# Patient Record
Sex: Female | Born: 1985 | Hispanic: No | Marital: Married | Smoking: Never smoker
Health system: Southern US, Community
[De-identification: ages and names within clinical notes are randomized; demographics above are authoritative.]

## PROBLEM LIST (undated history)

## (undated) ENCOUNTER — Inpatient Hospital Stay (HOSPITAL_COMMUNITY): Payer: Self-pay

## (undated) DIAGNOSIS — O24419 Gestational diabetes mellitus in pregnancy, unspecified control: Secondary | ICD-10-CM

## (undated) DIAGNOSIS — E119 Type 2 diabetes mellitus without complications: Secondary | ICD-10-CM

## (undated) DIAGNOSIS — Z8759 Personal history of other complications of pregnancy, childbirth and the puerperium: Secondary | ICD-10-CM

## (undated) HISTORY — DX: Type 2 diabetes mellitus without complications: E11.9

## (undated) HISTORY — PX: NO PAST SURGERIES: SHX2092

---

## 2016-12-06 ENCOUNTER — Inpatient Hospital Stay (HOSPITAL_COMMUNITY)
Admission: AD | Admit: 2016-12-06 | Discharge: 2016-12-07 | Disposition: A | Discharge status: Home / Self Care | Payer: 59 | Source: Ambulatory Visit | Attending: Obstetrics and Gynecology | Admitting: Obstetrics and Gynecology

## 2016-12-06 ENCOUNTER — Encounter (HOSPITAL_COMMUNITY): Payer: Self-pay | Admitting: *Deleted

## 2016-12-06 DIAGNOSIS — D251 Intramural leiomyoma of uterus: Secondary | ICD-10-CM | POA: Insufficient documentation

## 2016-12-06 DIAGNOSIS — O209 Hemorrhage in early pregnancy, unspecified: Secondary | ICD-10-CM | POA: Diagnosis not present

## 2016-12-06 DIAGNOSIS — O3412 Maternal care for benign tumor of corpus uteri, second trimester: Secondary | ICD-10-CM | POA: Insufficient documentation

## 2016-12-06 DIAGNOSIS — O2341 Unspecified infection of urinary tract in pregnancy, first trimester: Secondary | ICD-10-CM | POA: Diagnosis not present

## 2016-12-06 DIAGNOSIS — Z3A08 8 weeks gestation of pregnancy: Secondary | ICD-10-CM | POA: Diagnosis not present

## 2016-12-06 DIAGNOSIS — O3680X0 Pregnancy with inconclusive fetal viability, not applicable or unspecified: Secondary | ICD-10-CM

## 2016-12-06 LAB — POCT PREGNANCY, URINE: PREG TEST UR: POSITIVE — AB

## 2016-12-06 NOTE — MAU Note (Signed)
Spotting for past 4 days. Brown color. Tonight has changed to red and more like a period. No pain currently. Occ pain L side

## 2016-12-06 NOTE — MAU Provider Note (Signed)
History     CSN: [id number]  Arrival date and time: 12/06/16 2319   First Provider Initiated Contact with Patient 12/06/16 2354      Chief Complaint  Patient presents with  . Vaginal Bleeding   HPI [name] is a 31 y.o. G1P0 at [redacted]w[redacted]d who presents to MAU today with complaint of vaginal bleeding x 4 days. Initially it was a small amount of dark brown, but tonight became bright red. She denies pain, fever, N/V today. She states LMP 10/11/16.   OB History    Gravida Para Term Preterm AB Living   1             SAB TAB Ectopic Multiple Live Births                  Past Medical History:  Diagnosis Date  . Medical history non-contributory     Past Surgical History:  Procedure Laterality Date  . NO PAST SURGERIES      No family history on file.  Social History  Substance Use Topics  . Smoking status: Never Smoker  . Smokeless tobacco: Never Used  . Alcohol use No    Allergies: Allergies not on file  No prescriptions prior to admission.    Review of Systems  Constitutional: Negative for fever.  Gastrointestinal: Negative for abdominal pain, constipation, diarrhea, nausea and vomiting.  Genitourinary: Positive for vaginal bleeding. Negative for dysuria, frequency, urgency and vaginal discharge.   Physical Exam   Blood pressure 122/82, pulse (!) 105, temperature 98.1 F (36.7 C), resp. rate 16, height 5\' 3"  (1.6 m), weight 136 lb (61.7 kg), last menstrual period 10/11/2016.  Physical Exam  Nursing note and vitals reviewed. Constitutional: She is oriented to person, place, and time. She appears well-developed and well-nourished. No distress.  HENT:  Head: Normocephalic and atraumatic.  Cardiovascular: Normal rate.   Respiratory: Effort normal.  GI: Soft. She exhibits no distension and no mass. There is no tenderness. There is no rebound and no guarding.  Genitourinary: Uterus is not enlarged and not tender. Cervix exhibits no motion tenderness, no  discharge and no friability. Right adnexum displays no mass and no tenderness. Left adnexum displays no mass and no tenderness. There is bleeding (small) in the vagina. No vaginal discharge found.  Neurological: She is alert and oriented to person, place, and time.  Skin: Skin is warm and dry. No erythema.  Psychiatric: She has a normal mood and affect.    Results for orders placed or performed during the hospital encounter of 12/06/16 (from the past 24 hour(s))  Urinalysis, Routine w reflex microscopic     Status: Abnormal   Collection Time: 12/06/16 11:35 PM  Result Value Ref Range   Color, Urine YELLOW YELLOW   APPearance CLEAR CLEAR   Specific Gravity, Urine 1.010 1.005 - 1.030   pH 5.5 5.0 - 8.0   Glucose, UA NEGATIVE NEGATIVE mg/dL   Hgb urine dipstick LARGE (A) NEGATIVE   Bilirubin Urine NEGATIVE NEGATIVE   Ketones, ur NEGATIVE NEGATIVE mg/dL   Protein, ur 30 (A) NEGATIVE mg/dL   Nitrite POSITIVE (A) NEGATIVE   Leukocytes, UA NEGATIVE NEGATIVE  Urinalysis, Microscopic (reflex)     Status: Abnormal   Collection Time: 12/06/16 11:35 PM  Result Value Ref Range   RBC / HPF TOO NUMEROUS TO COUNT 0 - 5 RBC/hpf   WBC, UA TOO NUMEROUS TO COUNT 0 - 5 WBC/hpf   Bacteria, UA MANY (A) NONE SEEN  Squamous Epithelial / LPF 0-5 (A) NONE SEEN  Pregnancy, urine POC     Status: Abnormal   Collection Time: 12/07/16 12:00 AM  Result Value Ref Range   Preg Test, Ur POSITIVE (A) NEGATIVE  CBC with Differential/Platelet     Status: Abnormal   Collection Time: 12/07/16 12:12 AM  Result Value Ref Range   WBC 9.6 4.0 - 10.5 K/uL   RBC 4.88 3.87 - 5.11 MIL/uL   Hemoglobin 12.4 12.0 - 15.0 g/dL   HCT 38.0 36.0 - 46.0 %   MCV 77.9 (L) 78.0 - 100.0 fL   MCH 25.4 (L) 26.0 - 34.0 pg   MCHC 32.6 30.0 - 36.0 g/dL   RDW 14.0 11.5 - 15.5 %   Platelets 321 150 - 400 K/uL   Neutrophils Relative % 62 %   Neutro Abs 6.0 1.7 - 7.7 K/uL   Lymphocytes Relative 34 %   Lymphs Abs 3.2 0.7 - 4.0 K/uL    Monocytes Relative 3 %   Monocytes Absolute 0.3 0.1 - 1.0 K/uL   Eosinophils Relative 1 %   Eosinophils Absolute 0.1 0.0 - 0.7 K/uL   Basophils Relative 0 %   Basophils Absolute 0.0 0.0 - 0.1 K/uL  ABO/Rh     Status: None (Preliminary result)   Collection Time: 12/07/16 12:12 AM  Result Value Ref Range   ABO/RH(D) B POS   hCG, quantitative, pregnancy     Status: Abnormal   Collection Time: 12/07/16 12:12 AM  Result Value Ref Range   hCG, Beta Chain, Quant, S 10,564 (H) <5 mIU/mL   US Ob Comp Less 14 Wks  Result Date: 12/07/2016 CLINICAL DATA:  Positive pregnancy test, vaginal bleeding. Gestational age by last menstrual period 8 weeks and 1 days. EXAM: OBSTETRIC <14 WK [place] AND TRANSVAGINAL OB US TECHNIQUE: Both transabdominal and transvaginal ultrasound examinations were performed for complete evaluation of the gestation as well as the maternal uterus, adnexal regions, and pelvic cul-de-sac. Transvaginal technique was performed to assess early pregnancy. COMPARISON:  None. FINDINGS: Intrauterine gestational sac: Present with minimal internal echoes. Yolk sac:  Not present Embryo:  Not present Cardiac Activity: Not present MSD: 8  mm   5 w   3  d Subchorionic hemorrhage:  None visualized. Maternal uterus/adnexae: 2.3 cm LEFT dominant follicle. Otherwise unremarkable adnexae. Two subcentimeter hypoechoic intramural leiomyomas. IMPRESSION: Probable early intrauterine gestational sac, but no yolk sac, fetal pole, or cardiac activity yet visualized. Recommend follow-up quantitative B-HCG levels and follow-up US in 14 days to assess viability. This recommendation follows SRU consensus guidelines: Diagnostic Criteria for Nonviable Pregnancy Early in the First Trimester. [employer] [employer] 2013; 295:6213-08. Electronically Signed   By: Elon Alas M.D.   On: 12/07/2016 01:01   US Ob Transvaginal  Result Date: 12/07/2016 CLINICAL DATA:  Positive pregnancy test, vaginal bleeding. Gestational age by last  menstrual period 8 weeks and 1 days. EXAM: OBSTETRIC <14 WK [place] AND TRANSVAGINAL OB US TECHNIQUE: Both transabdominal and transvaginal ultrasound examinations were performed for complete evaluation of the gestation as well as the maternal uterus, adnexal regions, and pelvic cul-de-sac. Transvaginal technique was performed to assess early pregnancy. COMPARISON:  None. FINDINGS: Intrauterine gestational sac: Present with minimal internal echoes. Yolk sac:  Not present Embryo:  Not present Cardiac Activity: Not present MSD: 8  mm   5 w   3  d Subchorionic hemorrhage:  None visualized. Maternal uterus/adnexae: 2.3 cm LEFT dominant follicle. Otherwise unremarkable adnexae. Two subcentimeter hypoechoic intramural  leiomyomas. IMPRESSION: Probable early intrauterine gestational sac, but no yolk sac, fetal pole, or cardiac activity yet visualized. Recommend follow-up quantitative B-HCG levels and follow-up US in 14 days to assess viability. This recommendation follows SRU consensus guidelines: Diagnostic Criteria for Nonviable Pregnancy Early in the First Trimester. [employer] 2013; 660:6004-59. Electronically Signed   By: Elon Alas M.D.   On: 12/07/2016 01:01    MAU Course  Procedures None  MDM +UPT UA, CBC, ABO/Rh, quant hCG and [place] today to rule out ectopic pregnancy Discussed patient with Dr. Landry Mellow. Follow-up in MAU on Sunday for repeat hCG levels.  Urine culture ordered Assessment and Plan  A: Pregnancy of unknown location  Vaginal bleeding in pregnancy, first trimester  UTI in pregnancy, first trimester   P: Discharge home Rx for Macrobid given to the patient  Ectopic/bleeding precautions discussed Patient advised to follow-up in MAU on Sunday evening for 48 hours repeat hCG labs Patient may return to MAU as needed or if her condition were to change or worsen   Kerry Hough, PA-C 12/07/2016, 1:27 AM

## 2016-12-07 ENCOUNTER — Inpatient Hospital Stay (HOSPITAL_COMMUNITY): Payer: 59

## 2016-12-07 DIAGNOSIS — O209 Hemorrhage in early pregnancy, unspecified: Secondary | ICD-10-CM | POA: Diagnosis not present

## 2016-12-07 LAB — CBC WITH DIFFERENTIAL/PLATELET
Basophils Absolute: 0 10*3/uL (ref 0.0–0.1)
Basophils Relative: 0 %
EOS ABS: 0.1 10*3/uL (ref 0.0–0.7)
EOS PCT: 1 %
HCT: 38 % (ref 36.0–46.0)
HEMOGLOBIN: 12.4 g/dL (ref 12.0–15.0)
LYMPHS ABS: 3.2 10*3/uL (ref 0.7–4.0)
LYMPHS PCT: 34 %
MCH: 25.4 pg — AB (ref 26.0–34.0)
MCHC: 32.6 g/dL (ref 30.0–36.0)
MCV: 77.9 fL — AB (ref 78.0–100.0)
MONOS PCT: 3 %
Monocytes Absolute: 0.3 10*3/uL (ref 0.1–1.0)
NEUTROS PCT: 62 %
Neutro Abs: 6 10*3/uL (ref 1.7–7.7)
Platelets: 321 10*3/uL (ref 150–400)
RBC: 4.88 MIL/uL (ref 3.87–5.11)
RDW: 14 % (ref 11.5–15.5)
WBC: 9.6 10*3/uL (ref 4.0–10.5)

## 2016-12-07 LAB — URINALYSIS, ROUTINE W REFLEX MICROSCOPIC
BILIRUBIN URINE: NEGATIVE
Glucose, UA: NEGATIVE mg/dL
Ketones, ur: NEGATIVE mg/dL
Leukocytes, UA: NEGATIVE
Nitrite: POSITIVE — AB
PH: 5.5 (ref 5.0–8.0)
Protein, ur: 30 mg/dL — AB
SPECIFIC GRAVITY, URINE: 1.01 (ref 1.005–1.030)

## 2016-12-07 LAB — URINALYSIS, MICROSCOPIC (REFLEX)

## 2016-12-07 LAB — HCG, QUANTITATIVE, PREGNANCY: hCG, Beta Chain, Quant, S: 10564 m[IU]/mL — ABNORMAL HIGH (ref <5)

## 2016-12-07 LAB — ABO/RH: ABO/RH(D): B POS

## 2016-12-07 MED ORDER — NITROFURANTOIN MONOHYD MACRO 100 MG PO CAPS: 100.0000 mg | ORAL_CAPSULE | Freq: Two times a day (BID) | ORAL | 0 refills | Status: DC

## 2016-12-07 NOTE — Discharge Instructions (Signed)
Vaginal Bleeding During Pregnancy, First Trimester °A small amount of bleeding (spotting) from the vagina is common in early pregnancy. Sometimes the bleeding is normal and is not a problem, and sometimes it is a sign of something serious. Be sure to tell your doctor about any bleeding from your vagina right away. °Follow these instructions at home: °· Watch your condition for any changes. °· Follow your doctor's instructions about how active you can be. °· If you are on bed rest: °? You may need to stay in bed and only get up to use the bathroom. °? You may be allowed to do some activities. °? If you need help, make plans for someone to help you. °· Write down: °? The number of pads you use each day. °? How often you change pads. °? How soaked (saturated) your pads are. °· Do not use tampons. °· Do not douche. °· Do not have sex or orgasms until your doctor says it is okay. °· If you pass any tissue from your vagina, save the tissue so you can show it to your doctor. °· Only take medicines as told by your doctor. °· Do not take aspirin because it can make you bleed. °· Keep all follow-up visits as told by your doctor. °Contact a doctor if: °· You bleed from your vagina. °· You have cramps. °· You have labor pains. °· You have a fever that does not go away after you take medicine. °Get help right away if: °· You have very bad cramps in your back or belly (abdomen). °· You pass large clots or tissue from your vagina. °· You bleed more. °· You feel light-headed or weak. °· You pass out (faint). °· You have chills. °· You are leaking fluid or have a gush of fluid from your vagina. °· You pass out while pooping (having a bowel movement). °This information is not intended to replace advice given to you by your health care provider. Make sure you discuss any questions you have with your health care provider. °Document Released: 06/14/2013 Document Revised: 07/06/2015 Document Reviewed: 10/05/2012 °Elsevier Interactive  Patient Education © 2018 [employer] ° °

## 2016-12-08 ENCOUNTER — Inpatient Hospital Stay (HOSPITAL_COMMUNITY)
Admission: AD | Admit: 2016-12-08 | Discharge: 2016-12-08 | Disposition: A | Discharge status: Home / Self Care | Payer: 59 | Source: Ambulatory Visit | Attending: Obstetrics and Gynecology | Admitting: Obstetrics and Gynecology

## 2016-12-08 DIAGNOSIS — O209 Hemorrhage in early pregnancy, unspecified: Secondary | ICD-10-CM | POA: Diagnosis not present

## 2016-12-08 DIAGNOSIS — Z3A08 8 weeks gestation of pregnancy: Secondary | ICD-10-CM | POA: Diagnosis not present

## 2016-12-08 DIAGNOSIS — O200 Threatened abortion: Secondary | ICD-10-CM

## 2016-12-08 LAB — CULTURE, OB URINE: CULTURE: NO GROWTH

## 2016-12-08 LAB — HCG, QUANTITATIVE, PREGNANCY: HCG, BETA CHAIN, QUANT, S: 9154 m[IU]/mL — AB (ref <5)

## 2016-12-08 NOTE — MAU Note (Signed)
Pt here for follow up blood work. Was seen on Friday night with bleeding, no pain. Still no pain; bleeding is less today.

## 2016-12-08 NOTE — MAU Note (Signed)
Chief Complaint: Vaginal Bleeding   None    SUBJECTIVE HPI: Julie Fleming is a 31 y.o. G1P0 at [redacted]w[redacted]d who presents to [facility] Ad[facility] for repeat [facility].  Pt was seen late Friday night for vaginal bleeding in early pregnancy.  US showed gestational sac with no yolk sac or fetal pole.  QBHCG was  10564.  Pt denies bleeding or cramping. Pt was given Macrobid for UTI on Friday but has not started medication.   Desired pregnancy.  No other medical problems.   Past Medical History:  Diagnosis Date  . Medical history non-contributory    OB History  Gravida Para Term Preterm AB Living  1            SAB TAB Ectopic Multiple Live Births               # Outcome Date GA Lbr Len/2nd Weight Sex Delivery Anes PTL Lv  1 Current              Past Surgical History:  Procedure Laterality Date  . NO PAST SURGERIES     Social History   Social History  . Marital status: Married    Spouse name: N/A  . Number of children: N/A  . Years of education: N/A   Occupational History  . Not on file.   Social History Main Topics  . Smoking status: Never Smoker  . Smokeless tobacco: Never Used  . Alcohol use No  . Drug use: No  . Sexual activity: Yes    Birth control/ protection: None   Other Topics Concern  . Not on file   Social History Narrative  . No narrative on file   No family history on file. No current facility-administered medications on file prior to encounter.    Current Outpatient Prescriptions on File Prior to Encounter  Medication Sig Dispense Refill  . nitrofurantoin, macrocrystal-monohydrate, (MACROBID) 100 MG capsule Take 1 capsule (100 mg total) by mouth 2 (two) times daily. 14 capsule 0   No Known Allergies  I have reviewed patient's Past Medical Hx, Surgical Hx, Family Hx, Social Hx, medications and allergies.   Review of Systems  OBJECTIVE Patient Vitals for the past 24 hrs:  BP Temp Temp src Pulse Resp SpO2  12/08/16 2117 127/88 98.3 F (36.8 C) Oral  80 18 100 %  12/08/16 1926 (!) 106/59 97.6 F (36.4 C) - 86 16 100 %   Constitutional: Well-developed, well-nourished female in no acute distress.  Cardiovascular: normal rate Respiratory: normal rate and effort.   LAB RESULTS Results for orders placed or performed during the hospital encounter of 12/08/16 (from the past 24 hour(s))  hCG, quantitative, pregnancy     Status: Abnormal   Collection Time: 12/08/16  7:33 PM  Result Value Ref Range   hCG, Beta Chain, Quant, S 9,154 (H) <5 mIU/mL    IMAGING US Ob Comp Less 14 Wks  Result Date: 12/07/2016 CLINICAL DATA:  Positive pregnancy test, vaginal bleeding. Gestational age by last menstrual period 8 weeks and 1 days. EXAM: OBSTETRIC <14 WK [place] AND TRANSVAGINAL OB US TECHNIQUE: Both transabdominal and transvaginal ultrasound examinations were performed for complete evaluation of the gestation as well as the maternal uterus, adnexal regions, and pelvic cul-de-sac. Transvaginal technique was performed to assess early pregnancy. COMPARISON:  None. FINDINGS: Intrauterine gestational sac: Present with minimal internal echoes. Yolk sac:  Not present Embryo:  Not present Cardiac Activity: Not present MSD: 8  mm   5 w  3  d Subchorionic hemorrhage:  None visualized. Maternal uterus/adnexae: 2.3 cm LEFT dominant follicle. Otherwise unremarkable adnexae. Two subcentimeter hypoechoic intramural leiomyomas. IMPRESSION: Probable early intrauterine gestational sac, but no yolk sac, fetal pole, or cardiac activity yet visualized. Recommend follow-up quantitative B-HCG levels and follow-up US in 14 days to assess viability. This recommendation follows SRU consensus guidelines: Diagnostic Criteria for Nonviable Pregnancy Early in the First Trimester. [employer] 2013; 563:8756-43. Electronically Signed   By: Elon Alas M.D.   On: 12/07/2016 01:01   US Ob Transvaginal  Result Date: 12/07/2016 CLINICAL DATA:  Positive pregnancy test, vaginal bleeding.  Gestational age by last menstrual period 8 weeks and 1 days. EXAM: OBSTETRIC <14 WK [place] AND TRANSVAGINAL OB US TECHNIQUE: Both transabdominal and transvaginal ultrasound examinations were performed for complete evaluation of the gestation as well as the maternal uterus, adnexal regions, and pelvic cul-de-sac. Transvaginal technique was performed to assess early pregnancy. COMPARISON:  None. FINDINGS: Intrauterine gestational sac: Present with minimal internal echoes. Yolk sac:  Not present Embryo:  Not present Cardiac Activity: Not present MSD: 8  mm   5 w   3  d Subchorionic hemorrhage:  None visualized. Maternal uterus/adnexae: 2.3 cm LEFT dominant follicle. Otherwise unremarkable adnexae. Two subcentimeter hypoechoic intramural leiomyomas. IMPRESSION: Probable early intrauterine gestational sac, but no yolk sac, fetal pole, or cardiac activity yet visualized. Recommend follow-up quantitative B-HCG levels and follow-up US in 14 days to assess viability. This recommendation follows SRU consensus guidelines: Diagnostic Criteria for Nonviable Pregnancy Early in the First Trimester. [employer] Med 2013; 329:5188-41. Electronically Signed   By: Elon Alas M.D.   On: 12/07/2016 01:01    MAU COURSE Orders Placed This Encounter  Procedures  . hCG, quantitative, pregnancy  . Discharge activity:  No Restrictions  . No sexual activity restrictions  . Discharge patient Discharge disposition: 01-Home or Self Care; Discharge patient date: 12/08/2016   No orders of the defined types were placed in this encounter.   MDM Labs ASSESSMENT First trimester vaginal bleeding  R/O missed Abortion  PLAN Discussed decreasing QBHCG and most probably a missed abortion.  Labs drawn early but still decreasing.  Discussed that pt has appt with [facility] on Wednesday.  Can follow up with them for poc regarding treatment for missed abortion.  Discussed D and E, vs cytotec vs expectant management.  Given bleeding  precautions. Discharge home in stable condition.  Discussed Urine Culture negative no need to start medication.   Allergies as of 12/08/2016   No Known Allergies     Medication List    TAKE these medications   nitrofurantoin (macrocrystal-monohydrate) 100 MG capsule Commonly known as:  MACROBID Take 1 capsule (100 mg total) by mouth 2 (two) times daily.        Starla Link, CNM 12/08/2016  9:17 PM

## 2017-02-13 ENCOUNTER — Other Ambulatory Visit (HOSPITAL_COMMUNITY)
Admission: RE | Admit: 2017-02-13 | Discharge: 2017-02-13 | Disposition: A | Discharge status: Home / Self Care | Payer: 59 | Source: Ambulatory Visit | Attending: Obstetrics and Gynecology | Admitting: Obstetrics and Gynecology

## 2017-02-13 ENCOUNTER — Other Ambulatory Visit: Payer: Self-pay | Admitting: Obstetrics and Gynecology

## 2017-02-13 DIAGNOSIS — Z124 Encounter for screening for malignant neoplasm of cervix: Secondary | ICD-10-CM | POA: Insufficient documentation

## 2017-02-18 LAB — CYTOLOGY - PAP
DIAGNOSIS: NEGATIVE
HPV: NOT DETECTED

## 2017-05-13 LAB — OB RESULTS CONSOLE HIV ANTIBODY (ROUTINE TESTING): HIV: NONREACTIVE

## 2017-05-13 LAB — OB RESULTS CONSOLE ABO/RH: RH TYPE: POSITIVE

## 2017-05-13 LAB — OB RESULTS CONSOLE GC/CHLAMYDIA
CHLAMYDIA, DNA PROBE: NEGATIVE
GC PROBE AMP, GENITAL: NEGATIVE

## 2017-05-13 LAB — OB RESULTS CONSOLE RPR: RPR: NONREACTIVE

## 2017-05-13 LAB — OB RESULTS CONSOLE RUBELLA ANTIBODY, IGM: RUBELLA: IMMUNE

## 2017-05-13 LAB — OB RESULTS CONSOLE HEPATITIS B SURFACE ANTIGEN: Hepatitis B Surface Ag: NEGATIVE

## 2017-05-13 LAB — OB RESULTS CONSOLE ANTIBODY SCREEN: ANTIBODY SCREEN: NEGATIVE

## 2017-10-10 ENCOUNTER — Encounter (HOSPITAL_COMMUNITY): Payer: Self-pay

## 2017-10-15 ENCOUNTER — Encounter: Payer: 59 | Attending: Obstetrics and Gynecology | Admitting: Skilled Nursing Facility1

## 2017-10-15 ENCOUNTER — Encounter: Payer: Self-pay | Admitting: Skilled Nursing Facility1

## 2017-10-15 DIAGNOSIS — O99810 Abnormal glucose complicating pregnancy: Secondary | ICD-10-CM

## 2017-10-15 DIAGNOSIS — Z713 Dietary counseling and surveillance: Secondary | ICD-10-CM | POA: Insufficient documentation

## 2017-10-15 NOTE — Progress Notes (Signed)
Diabetes Self-Management Education  Visit Type: First/Initial  10/16/2017  [name], identified by name and date of birth, is a 32 y.o. female with a diagnosis of Diabetes: Gestational Diabetes.   ASSESSMENT  Height 5\' 3"  (1.6 m), weight 166 lb 8 oz (75.5 kg), unknown if currently breastfeeding. Body mass index is 29.49 kg/m.   Pt arrives in pain due to her carpal tunnel.  Pt states she is [redacted] weeks along in her pregnancy.  Pt checked her blood sugar in office 4 hours after having eaten a green smoothie with fruit: 136 Pt is pescatarian. Pt was given contour next [id number]  10/12/2018   Diabetes Self-Management Education - 10/15/17 1556      Visit Information   Visit Type  First/Initial      Initial Visit   Diabetes Type  Gestational Diabetes    Are you currently following a meal plan?  No    Are you taking your medications as prescribed?  No      Health Coping   How would you rate your overall health?  Good      Psychosocial Assessment   Patient Belief/Attitude about Diabetes  Motivated to manage diabetes    Other persons present  Spouse/SO      Pre-Education Assessment   Patient understands the diabetes disease and treatment process.  Needs Instruction    Patient understands incorporating nutritional management into lifestyle.  Needs Instruction    Patient undertands incorporating physical activity into lifestyle.  Needs Instruction    Patient understands using medications safely.  Needs Instruction    Patient understands monitoring blood glucose, interpreting and using results  Needs Instruction    Patient understands prevention, detection, and treatment of acute complications.  Needs Instruction    Patient understands prevention, detection, and treatment of chronic complications.  Needs Instruction    Patient understands how to develop strategies to address psychosocial issues.  Needs Instruction    Patient understands how to develop strategies to  promote health/change behavior.  Needs Instruction      Complications   Have you had a dilated eye exam in the past 12 months?  Yes    Have you had a dental exam in the past 12 months?  Yes    Are you checking your feet?  N/A      Dietary Intake   Breakfast  oatmeal with milk, almonds, blueberries, chia seeds or egg whites and fruit and 1 whole milk or 2 slices of whole grain bread and omelot     Lunch  curried spinach with millets and lentil with 3 green leaves     Dinner  same curry      Exercise   Exercise Type  ADL's    How many days per week to you exercise?  0    How many minutes per day do you exercise?  0    Total minutes per week of exercise  0      Patient Education   Previous Diabetes Education  No    Disease state   Factors that contribute to the development of diabetes;Explored patient's options for treatment of their diabetes    Nutrition management   Role of diet in the treatment of diabetes and the relationship between the three main macronutrients and blood glucose level;Carbohydrate counting;Reviewed blood glucose goals for pre and post meals and how to evaluate the patients' food intake on their blood glucose level.;Food label reading, portion sizes and measuring food.  Physical activity and exercise   Role of exercise on diabetes management, blood pressure control and cardiac health.    Monitoring  Purpose and frequency of SMBG.;Taught/evaluated SMBG meter.;Taught/discussed recording of test results and interpretation of SMBG.    Acute complications  Taught treatment of hypoglycemia - the 15 rule.    Psychosocial adjustment  Worked with patient to identify barriers to care and solutions;Role of stress on diabetes;Helped patient identify a support system for diabetes management;Identified and addressed patients feelings and concerns about diabetes    Preconception care  Reviewed with patient blood glucose goals with pregnancy      Individualized Goals (developed by  patient)   Nutrition  Follow meal plan discussed;General guidelines for healthy choices and portions discussed    Physical Activity  Exercise 5-7 days per week;30 minutes per day    Monitoring   test my blood glucose as discussed;test blood glucose pre and post meals as discussed      Post-Education Assessment   Patient understands the diabetes disease and treatment process.  Demonstrates understanding / competency    Patient understands incorporating nutritional management into lifestyle.  Demonstrates understanding / competency    Patient undertands incorporating physical activity into lifestyle.  Demonstrates understanding / competency    Patient understands using medications safely.  Demonstrates understanding / competency    Patient understands monitoring blood glucose, interpreting and using results  Demonstrates understanding / competency    Patient understands prevention, detection, and treatment of acute complications.  Demonstrates understanding / competency    Patient understands prevention, detection, and treatment of chronic complications.  Demonstrates understanding / competency    Patient understands how to develop strategies to address psychosocial issues.  Demonstrates understanding / competency    Patient understands how to develop strategies to promote health/change behavior.  Demonstrates understanding / competency      Outcomes   Expected Outcomes  Demonstrated interest in learning. Expect positive outcomes    Future DMSE  PRN    Program Status  Completed       Individualized Plan for Diabetes Self-Management Training:   Learning Objective:  Patient will have a greater understanding of diabetes self-management. Patient education plan is to attend individual and/or group sessions per assessed needs and concerns.   Plan:   There are no Patient Instructions on file for this visit.  Expected Outcomes:  Demonstrated interest in learning. Expect positive  outcomes  Education material provided: Meal plan card, Snack sheet and Carbohydrate counting sheet  If problems or questions, patient to contact team via:  Phone  Future DSME appointment: PRN

## 2017-10-23 ENCOUNTER — Encounter: Payer: 59 | Admitting: *Deleted

## 2017-10-23 ENCOUNTER — Ambulatory Visit: Payer: 59 | Admitting: *Deleted

## 2017-10-23 DIAGNOSIS — R7302 Impaired glucose tolerance (oral): Secondary | ICD-10-CM

## 2017-10-23 DIAGNOSIS — Z713 Dietary counseling and surveillance: Secondary | ICD-10-CM | POA: Diagnosis not present

## 2017-10-23 NOTE — Progress Notes (Signed)
Insulin Instruction  Patient was seen on 10/23/2017 for insulin instruction.  The following learning objectives were met by the patient during this visit:   Insulin Action of NPH and Regular insulins  Reviewed syringe & vial including # units per syringe and   length of needles  Hygiene and storage  Drawing up single and mixed doses if using vials   Single dose   Mixed dose:   Rotation of Sites  Hypoglycemia- symptoms, causes , treatment choices  Record keeping and MD follow up  Hypoglycemia, causes, symptoms and treatment   Patient demonstrated understanding of insulin administration by return demonstration. Orders from MD: Pre breakfast: 20 units NPH + 8 units Regular Pre Supper: 8 units Regular Pre Bedtime: 10 units NPH  Patient received the following handouts:  Insulin Instruction Handout                                        Patient to start on insulin as Rx'd by MD  Patient will be seen for follow-up as needed.

## 2017-11-24 ENCOUNTER — Encounter (HOSPITAL_COMMUNITY): Payer: Self-pay

## 2017-11-24 ENCOUNTER — Ambulatory Visit (HOSPITAL_COMMUNITY)
Admission: RE | Admit: 2017-11-24 | Discharge: 2017-11-24 | Disposition: A | Discharge status: Home / Self Care | Payer: 59 | Source: Ambulatory Visit | Attending: Obstetrics and Gynecology | Admitting: Obstetrics and Gynecology

## 2017-11-24 VITALS — BP 103/63 | HR 93 | Wt 168.0 lb

## 2017-11-24 DIAGNOSIS — Z3A33 33 weeks gestation of pregnancy: Secondary | ICD-10-CM | POA: Diagnosis not present

## 2017-11-24 DIAGNOSIS — O24414 Gestational diabetes mellitus in pregnancy, insulin controlled: Secondary | ICD-10-CM | POA: Insufficient documentation

## 2017-11-24 HISTORY — DX: Gestational diabetes mellitus in pregnancy, unspecified control: O24.419

## 2017-11-24 NOTE — Procedures (Signed)
Julie Fleming 04-04-1985 [redacted]w[redacted]d  Fetus A Non-Stress Test Interpretation for 11/24/17  Indication: GDM-Insulin  Fetal Heart Rate A Mode: External Baseline Rate (A): 145 bpm Variability: Moderate Accelerations: 15 x 15 Decelerations: None Multiple birth?: No  Uterine Activity Mode: Palpation, Toco Contraction Frequency (min): Erratic UI Contraction Duration (sec): 10-30 Contraction Quality: Mild Resting Tone Palpated: Relaxed Resting Time: Adequate  Interpretation (Fetal Testing) Nonstress Test Interpretation: Reactive Overall Impression: Reassuring for gestational age Comments: EFM tracing reviewed by Dr. Yates Decamp

## 2017-12-04 ENCOUNTER — Other Ambulatory Visit: Payer: Self-pay

## 2017-12-04 ENCOUNTER — Inpatient Hospital Stay (HOSPITAL_COMMUNITY)
Admission: AD | Admit: 2017-12-04 | Discharge: 2017-12-04 | Disposition: A | Discharge status: Home / Self Care | Payer: 59 | Source: Ambulatory Visit | Attending: Obstetrics and Gynecology | Admitting: Obstetrics and Gynecology

## 2017-12-04 DIAGNOSIS — O4703 False labor before 37 completed weeks of gestation, third trimester: Secondary | ICD-10-CM | POA: Diagnosis not present

## 2017-12-04 DIAGNOSIS — Z3A35 35 weeks gestation of pregnancy: Secondary | ICD-10-CM | POA: Diagnosis not present

## 2017-12-04 DIAGNOSIS — Z7982 Long term (current) use of aspirin: Secondary | ICD-10-CM | POA: Insufficient documentation

## 2017-12-04 DIAGNOSIS — Z3689 Encounter for other specified antenatal screening: Secondary | ICD-10-CM

## 2017-12-04 LAB — URINALYSIS, ROUTINE W REFLEX MICROSCOPIC
Bilirubin Urine: NEGATIVE
Glucose, UA: NEGATIVE mg/dL
Hgb urine dipstick: NEGATIVE
KETONES UR: NEGATIVE mg/dL
Nitrite: NEGATIVE
PROTEIN: NEGATIVE mg/dL
Specific Gravity, Urine: 1.005 (ref 1.005–1.030)
pH: 8 (ref 5.0–8.0)

## 2017-12-04 NOTE — Discharge Instructions (Signed)
Braxton Hicks Contractions °Contractions of the uterus can occur throughout pregnancy, but they are not always a sign that you are in labor. You may have practice contractions called Braxton Hicks contractions. These false labor contractions are sometimes confused with true labor. °What are Braxton Hicks contractions? °Braxton Hicks contractions are tightening movements that occur in the muscles of the uterus before labor. Unlike true labor contractions, these contractions do not result in opening (dilation) and thinning of the cervix. Toward the end of pregnancy (32-34 weeks), Braxton Hicks contractions can happen more often and may become stronger. These contractions are sometimes difficult to tell apart from true labor because they can be very uncomfortable. You should not feel embarrassed if you go to the hospital with false labor. °Sometimes, the only way to tell if you are in true labor is for your health care provider to look for changes in the cervix. The health care provider will do a physical exam and may monitor your contractions. If you are not in true labor, the exam should show that your cervix is not dilating and your water has not broken. °If there are other health problems associated with your pregnancy, it is completely safe for you to be sent home with false labor. You may continue to have Braxton Hicks contractions until you go into true labor. °How to tell the difference between true labor and false labor °True labor °· Contractions last 30-70 seconds. °· Contractions become very regular. °· Discomfort is usually felt in the top of the uterus, and it spreads to the lower abdomen and low back. °· Contractions do not go away with walking. °· Contractions usually become more intense and increase in frequency. °· The cervix dilates and gets thinner. °False labor °· Contractions are usually shorter and not as strong as true labor contractions. °· Contractions are usually irregular. °· Contractions  are often felt in the front of the lower abdomen and in the groin. °· Contractions may go away when you walk around or change positions while lying down. °· Contractions get weaker and are shorter-lasting as time goes on. °· The cervix usually does not dilate or become thin. °Follow these instructions at home: °· Take over-the-counter and prescription medicines only as told by your health care provider. °· Keep up with your usual exercises and follow other instructions from your health care provider. °· Eat and drink lightly if you think you are going into labor. °· If Braxton Hicks contractions are making you uncomfortable: °? Change your position from lying down or resting to walking, or change from walking to resting. °? Sit and rest in a tub of warm water. °? Drink enough fluid to keep your urine pale yellow. Dehydration may cause these contractions. °? Do slow and deep breathing several times an hour. °· Keep all follow-up prenatal visits as told by your health care provider. This is important. °Contact a health care provider if: °· You have a fever. °· You have continuous pain in your abdomen. °Get help right away if: °· Your contractions become stronger, more regular, and closer together. °· You have fluid leaking or gushing from your vagina. °· You pass blood-tinged mucus (bloody show). °· You have bleeding from your vagina. °· You have low back pain that you never had before. °· You feel your baby’s head pushing down and causing pelvic pressure. °· Your baby is not moving inside you as much as it used to. °Summary °· Contractions that occur before labor are called Braxton   Hicks contractions, false labor, or practice contractions. °· Braxton Hicks contractions are usually shorter, weaker, farther apart, and less regular than true labor contractions. True labor contractions usually become progressively stronger and regular and they become more frequent. °· Manage discomfort from Braxton Hicks contractions by  changing position, resting in a warm bath, drinking plenty of water, or practicing deep breathing. °This information is not intended to replace advice given to you by your health care provider. Make sure you discuss any questions you have with your health care provider. °Document Released: 06/13/2016 Document Revised: 06/13/2016 Document Reviewed: 06/13/2016 °Elsevier Interactive Patient Education © 2018 [employer] ° °

## 2017-12-04 NOTE — MAU Provider Note (Signed)
History     CSN: [id number]  Arrival date and time: 12/04/17 1100   First Provider Initiated Contact with Patient 12/04/17 1137      Chief Complaint  Patient presents with  . Contractions   G2P0010 @35 .2 wks sent fro office for ctx. Reports ctx have been ongoing for weeks but worse over the last 5-6 days. Denies VB or LOF. Good FM. Her pregnancy is complicated by GDM on Insulin.    OB History    Gravida  2   Para      Term      Preterm      AB  1   Living  0     SAB  1   TAB      Ectopic      Multiple      Live Births              Past Medical History:  Diagnosis Date  . Diabetes mellitus without complication ([place])   . Gestational diabetes     Past Surgical History:  Procedure Laterality Date  . NO PAST SURGERIES      Family History  Problem Relation Age of Onset  . Diabetes Mother   . Hypertension Mother     Social History   Tobacco Use  . Smoking status: Never Smoker  . Smokeless tobacco: Never Used  Substance Use Topics  . Alcohol use: No  . Drug use: No    Allergies: No Known Allergies  Medications Prior to Admission  Medication Sig Dispense Refill Last Dose  . aspirin EC 81 MG tablet Take 81 mg by mouth daily.   Taking  . FERROUS SULFATE PO Take by mouth.   Taking  . insulin NPH Human (HUMULIN N,NOVOLIN N) 100 UNIT/ML injection Inject into the skin.   Taking  . insulin regular (NOVOLIN R,HUMULIN R) 100 units/mL injection Inject into the skin 3 (three) times daily before meals.   Taking  . Prenatal Vit-Fe Fumarate-FA (PRENATAL VITAMIN PO) Take by mouth.   Taking    Review of Systems  Gastrointestinal: Positive for abdominal pain (ctx).  Genitourinary: Negative for vaginal bleeding and vaginal discharge.   Physical Exam   Blood pressure 109/70, pulse 97, temperature 98.2 F (36.8 C), temperature source Oral, resp. rate 15, height 5\' 2"  (1.575 m), weight 75.3 kg, last menstrual period 04/01/2017, SpO2 99 %, unknown if  currently breastfeeding.  Physical Exam  Constitutional: She is oriented to person, place, and time. She appears well-developed and well-nourished. No distress.  HENT:  Head: Normocephalic and atraumatic.  Neck: Normal range of motion.  Respiratory: Effort normal. No respiratory distress.  GI: Soft. She exhibits no distension. There is no tenderness.  gravid  Genitourinary:  Genitourinary Comments: VE: closed/thick Did not tolerate exam well  Musculoskeletal: Normal range of motion.  Neurological: She is alert and oriented to person, place, and time.  Skin: Skin is warm and dry.  Psychiatric: She has a normal mood and affect.  EFM: 145 bpm, mod variability, + accels, no decels Toco: irregular w/irritability  Results for orders placed or performed during the hospital encounter of 12/04/17 (from the past 24 hour(s))  Urinalysis, Routine w reflex microscopic     Status: Abnormal   Collection Time: 12/04/17 12:00 PM  Result Value Ref Range   Color, Urine STRAW (A) YELLOW   APPearance HAZY (A) CLEAR   Specific Gravity, Urine 1.005 1.005 - 1.030   pH 8.0 5.0 - 8.0   Glucose,  UA NEGATIVE NEGATIVE mg/dL   Hgb urine dipstick NEGATIVE NEGATIVE   Bilirubin Urine NEGATIVE NEGATIVE   Ketones, ur NEGATIVE NEGATIVE mg/dL   Protein, ur NEGATIVE NEGATIVE mg/dL   Nitrite NEGATIVE NEGATIVE   Leukocytes, UA SMALL (A) NEGATIVE   RBC / HPF 0-5 0 - 5 RBC/hpf   WBC, UA 0-5 0 - 5 WBC/hpf   Bacteria, UA MANY (A) NONE SEEN   Squamous Epithelial / LPF 0-5 0 - 5   MAU Course  Procedures Po hydration  MDM Labs ordered and reviewed. UA with many bacteria, pt asymptomatic, will send UC. Pt felt a couple strong ctx since arrival. No evidence of PTL. Stable for discharge home.   Assessment and Plan   1. [redacted] weeks gestation of pregnancy   2. NST (non-stress test) reactive   3. Preterm uterine contractions in third trimester, antepartum    Discharge home Follow up at [facility] next week as  scheduled PTL precautions Hydrate  Allergies as of 12/04/2017   No Known Allergies     Medication List    TAKE these medications   acetaminophen 500 MG tablet Commonly known as:  TYLENOL Take 1,000 mg by mouth every 6 (six) hours as needed for mild pain.   aspirin EC 81 MG tablet Take 81 mg by mouth at bedtime.   insulin NPH Human 100 UNIT/ML injection Commonly known as:  HUMULIN N,NOVOLIN N Inject 10-24 Units into the skin 2 (two) times daily. Patient uses 24 units in the morning and 10 units at bedtime.   insulin regular 100 units/mL injection Commonly known as:  NOVOLIN R,HUMULIN R Inject 8-16 Units into the skin 2 (two) times daily before a meal. Patient uses 8 units before breakfast and 16 units before dinner.   INTEGRA 62.5-62.5-40-3 MG Caps Take 1 capsule by mouth daily.   prenatal multivitamin Tabs tablet Take 1 tablet by mouth at bedtime.      Julianne Handler, CNM 12/04/2017, 11:46 AM

## 2017-12-04 NOTE — MAU Note (Signed)
Pt sent over from office to r/o labor. Contractions, denies bleeding.

## 2017-12-04 NOTE — MAU Note (Signed)
Pt offered an interpreter and declines. Pt states,"I can understanding English pretty well and so can my husband".  Gilmer Mor RN

## 2017-12-04 NOTE — Progress Notes (Signed)
MAU obs started at [place]

## 2017-12-06 LAB — OB RESULTS CONSOLE GBS: GBS: NEGATIVE

## 2017-12-07 LAB — CULTURE, OB URINE

## 2017-12-23 ENCOUNTER — Telehealth (HOSPITAL_COMMUNITY): Payer: Self-pay | Admitting: *Deleted

## 2017-12-26 ENCOUNTER — Encounter (HOSPITAL_COMMUNITY): Payer: Self-pay | Admitting: *Deleted

## 2017-12-26 NOTE — Telephone Encounter (Signed)
Preadmission screen  

## 2017-12-27 ENCOUNTER — Encounter (HOSPITAL_COMMUNITY): Payer: Self-pay

## 2017-12-27 ENCOUNTER — Inpatient Hospital Stay (EMERGENCY_DEPARTMENT_HOSPITAL)
Admission: AD | Admit: 2017-12-27 | Discharge: 2017-12-27 | Disposition: A | Discharge status: Home / Self Care | Payer: 59 | Source: Ambulatory Visit | Attending: Obstetrics and Gynecology | Admitting: Obstetrics and Gynecology

## 2017-12-27 DIAGNOSIS — Z7982 Long term (current) use of aspirin: Secondary | ICD-10-CM

## 2017-12-27 DIAGNOSIS — Z794 Long term (current) use of insulin: Secondary | ICD-10-CM | POA: Insufficient documentation

## 2017-12-27 DIAGNOSIS — O368130 Decreased fetal movements, third trimester, not applicable or unspecified: Secondary | ICD-10-CM | POA: Insufficient documentation

## 2017-12-27 DIAGNOSIS — O24113 Pre-existing diabetes mellitus, type 2, in pregnancy, third trimester: Secondary | ICD-10-CM | POA: Insufficient documentation

## 2017-12-27 DIAGNOSIS — Z3A38 38 weeks gestation of pregnancy: Secondary | ICD-10-CM

## 2017-12-27 DIAGNOSIS — O411230 Chorioamnionitis, third trimester, not applicable or unspecified: Secondary | ICD-10-CM | POA: Diagnosis present

## 2017-12-27 DIAGNOSIS — O4103X0 Oligohydramnios, third trimester, not applicable or unspecified: Secondary | ICD-10-CM | POA: Diagnosis present

## 2017-12-27 DIAGNOSIS — Z3689 Encounter for other specified antenatal screening: Secondary | ICD-10-CM | POA: Diagnosis not present

## 2017-12-27 DIAGNOSIS — Z833 Family history of diabetes mellitus: Secondary | ICD-10-CM | POA: Insufficient documentation

## 2017-12-27 DIAGNOSIS — O479 False labor, unspecified: Secondary | ICD-10-CM

## 2017-12-27 DIAGNOSIS — O24424 Gestational diabetes mellitus in childbirth, insulin controlled: Principal | ICD-10-CM | POA: Diagnosis present

## 2017-12-27 LAB — URINALYSIS, ROUTINE W REFLEX MICROSCOPIC
Bilirubin Urine: NEGATIVE
Glucose, UA: NEGATIVE mg/dL
Hgb urine dipstick: NEGATIVE
Ketones, ur: NEGATIVE mg/dL
Leukocytes, UA: NEGATIVE
Nitrite: NEGATIVE
Protein, ur: NEGATIVE mg/dL
Specific Gravity, Urine: 1.004 — ABNORMAL LOW (ref 1.005–1.030)
pH: 8 (ref 5.0–8.0)

## 2017-12-27 NOTE — Discharge Instructions (Signed)
Vaginal Delivery Vaginal delivery means that you will give birth by pushing your baby out of your birth canal (vagina). A team of health care providers will help you before, during, and after vaginal delivery. Birth experiences are unique for every woman and every pregnancy, and birth experiences vary depending on where you choose to give birth. What should I do to prepare for my baby's birth? Before your baby is born, it is important to talk with your health care provider about:  Your labor and delivery preferences. These may include: ? Medicines that you may be given. ? How you will manage your pain. This might include non-medical pain relief techniques or injectable pain relief such as epidural analgesia. ? How you and your baby will be monitored during labor and delivery. ? Who may be in the labor and delivery room with you. ? Your feelings about surgical delivery of your baby (cesarean delivery, or C-section) if this becomes necessary. ? Your feelings about receiving donated blood through an IV tube (blood transfusion) if this becomes necessary.  Whether you are able: ? To take pictures or videos of the birth. ? To eat during labor and delivery. ? To move around, walk, or change positions during labor and delivery.  What to expect after your baby is born, such as: ? Whether delayed umbilical cord clamping and cutting is offered. ? Who will care for your baby right after birth. ? Medicines or tests that may be recommended for your baby. ? Whether breastfeeding is supported in your hospital or birth center. ? How long you will be in the hospital or birth center.  How any medical conditions you have may affect your baby or your labor and delivery experience.  To prepare for your baby's birth, you should also:  Attend all of your health care visits before delivery (prenatal visits) as recommended by your health care provider. This is important.  Prepare your home for your baby's  arrival. Make sure that you have: ? Diapers. ? Baby clothing. ? Feeding equipment. ? Safe sleeping arrangements for you and your baby.  Install a car seat in your vehicle. Have your car seat checked by a certified car seat installer to make sure that it is installed safely.  Think about who will help you with your new baby at home for at least the first several weeks after delivery.  What can I expect when I arrive at the birth center or hospital? Once you are in labor and have been admitted into the hospital or birth center, your health care provider may:  Review your pregnancy history and any concerns you have.  Insert an IV tube into one of your veins. This is used to give you fluids and medicines.  Check your blood pressure, pulse, temperature, and heart rate (vital signs).  Check whether your bag of water (amniotic sac) has broken (ruptured).  Talk with you about your birth plan and discuss pain control options.  Monitoring Your health care provider may monitor your contractions (uterine monitoring) and your baby's heart rate (fetal monitoring). You may need to be monitored:  Often, but not continuously (intermittently).  All the time or for long periods at a time (continuously). Continuous monitoring may be needed if: ? You are taking certain medicines, such as medicine to relieve pain or make your contractions stronger. ? You have pregnancy or labor complications.  Monitoring may be done by:  Placing a special stethoscope or a handheld monitoring device on your abdomen to   check your baby's heartbeat, and feeling your abdomen for contractions. This method of monitoring does not continuously record your baby's heartbeat or your contractions.  Placing monitors on your abdomen (external monitors) to record your baby's heartbeat and the frequency and length of contractions. You may not have to wear external monitors all the time.  Placing monitors inside of your uterus  (internal monitors) to record your baby's heartbeat and the frequency, length, and strength of your contractions. ? Your health care provider may use internal monitors if he or she needs more information about the strength of your contractions or your baby's heart rate. ? Internal monitors are put in place by passing a thin, flexible wire through your vagina and into your uterus. Depending on the type of monitor, it may remain in your uterus or on your baby's head until birth. ? Your health care provider will discuss the benefits and risks of internal monitoring with you and will ask for your permission before inserting the monitors.  Telemetry. This is a type of continuous monitoring that can be done with external or internal monitors. Instead of having to stay in bed, you are able to move around during telemetry. Ask your health care provider if telemetry is an option for you.  Physical exam Your health care provider may perform a physical exam. This may include:  Checking whether your baby is positioned: ? With the head toward your vagina (head-down). This is most common. ? With the head toward the top of your uterus (head-up or breech). If your baby is in a breech position, your health care provider may try to turn your baby to a head-down position so you can deliver vaginally. If it does not seem that your baby can be born vaginally, your provider may recommend surgery to deliver your baby. In rare cases, you may be able to deliver vaginally if your baby is head-up (breech delivery). ? Lying sideways (transverse). Babies that are lying sideways cannot be delivered vaginally.  Checking your cervix to determine: ? Whether it is thinning out (effacing). ? Whether it is opening up (dilating). ? How low your baby has moved into your birth canal.  What are the three stages of labor and delivery?  Normal labor and delivery is divided into the following three stages: Stage 1  Stage 1 is the  longest stage of labor, and it can last for hours or days. Stage 1 includes: ? Early labor. This is when contractions may be irregular, or regular and mild. Generally, early labor contractions are more than 10 minutes apart. ? Active labor. This is when contractions get longer, more regular, more frequent, and more intense. ? The transition phase. This is when contractions happen very close together, are very intense, and may last longer than during any other part of labor.  Contractions generally feel mild, infrequent, and irregular at first. They get stronger, more frequent (about every 2-3 minutes), and more regular as you progress from early labor through active labor and transition.  Many women progress through stage 1 naturally, but you may need help to continue making progress. If this happens, your health care provider may talk with you about: ? Rupturing your amniotic sac if it has not ruptured yet. ? Giving you medicine to help make your contractions stronger and more frequent.  Stage 1 ends when your cervix is completely dilated to 4 inches (10 cm) and completely effaced. This happens at the end of the transition phase. Stage 2  Once   your cervix is completely effaced and dilated to 4 inches (10 cm), you may start to feel an urge to push. It is common for the body to naturally take a rest before feeling the urge to push, especially if you received an epidural or certain other pain medicines. This rest period may last for up to 1-2 hours, depending on your unique labor experience.  During stage 2, contractions are generally less painful, because pushing helps relieve contraction pain. Instead of contraction pain, you may feel stretching and burning pain, especially when the widest part of your baby's head passes through the vaginal opening (crowning).  Your health care provider will closely monitor your pushing progress and your baby's progress through the vagina during stage 2.  Your  health care provider may massage the area of skin between your vaginal opening and anus (perineum) or apply warm compresses to your perineum. This helps it stretch as the baby's head starts to crown, which can help prevent perineal tearing. ? In some cases, an incision may be made in your perineum (episiotomy) to allow the baby to pass through the vaginal opening. An episiotomy helps to make the opening of the vagina larger to allow more room for the baby to fit through.  It is very important to breathe and focus so your health care provider can control the delivery of your baby's head. Your health care provider may have you decrease the intensity of your pushing, to help prevent perineal tearing.  After delivery of your baby's head, the shoulders and the rest of the body generally deliver very quickly and without difficulty.  Once your baby is delivered, the umbilical cord may be cut right away, or this may be delayed for 1-2 minutes, depending on your baby's health. This may vary among health care providers, hospitals, and birth centers.  If you and your baby are healthy enough, your baby may be placed on your chest or abdomen to help maintain the baby's temperature and to help you bond with each other. Some mothers and babies start breastfeeding at this time. Your health care team will dry your baby and help keep your baby warm during this time.  Your baby may need immediate care if he or she: ? Showed signs of distress during labor. ? Has a medical condition. ? Was born too early (prematurely). ? Had a bowel movement before birth (meconium). ? Shows signs of difficulty transitioning from being inside the uterus to being outside of the uterus. If you are planning to breastfeed, your health care team will help you begin a feeding. Stage 3  The third stage of labor starts immediately after the birth of your baby and ends after you deliver the placenta. The placenta is an organ that develops  during pregnancy to provide oxygen and nutrients to your baby in the womb.  Delivering the placenta may require some pushing, and you may have mild contractions. Breastfeeding can stimulate contractions to help you deliver the placenta.  After the placenta is delivered, your uterus should tighten (contract) and become firm. This helps to stop bleeding in your uterus. To help your uterus contract and to control bleeding, your health care provider may: ? Give you medicine by injection, through an IV tube, by mouth, or through your rectum (rectally). ? Massage your abdomen or perform a vaginal exam to remove any blood clots that are left in your uterus. ? Empty your bladder by placing a thin, flexible tube (catheter) into your bladder. ? Encourage   you to breastfeed your baby. After labor is over, you and your baby will be monitored closely to ensure that you are both healthy until you are ready to go home. Your health care team will teach you how to care for yourself and your baby. This information is not intended to replace advice given to you by your health care provider. Make sure you discuss any questions you have with your health care provider. Document Released: 11/07/2007 Document Revised: 08/18/2015 Document Reviewed: 02/12/2015 Elsevier Interactive Patient Education  2018 [employer]  

## 2017-12-27 NOTE — MAU Provider Note (Signed)
History     CSN: [id number]  Arrival date and time: 12/27/17 1137   First Provider Initiated Contact with Patient 12/27/17 1212      Chief Complaint  Patient presents with  . Decreased Fetal Movement  . Contractions   HPI  Julie Fleming is a 32 y.o. G2P0010 at [redacted]w[redacted]d who presents to MAU with chief complaint of labor contractions and decreased fetal movement.   Labor contractions This is a new problem, onset yesterday. Patient endorses irregular contractions which occur several times each hour and last approximately 10 minutes.  States she feels compelled to tense up and shake during contractions due to discomfort.  Decreased fetal movement This is a new problem, onset yesterday. Patient states she called her on-call provider yesterday and was instructed to obtain kick counts. She did not elicit 6 movements in the hour but was unsure if she should report to MAU. Woke up this morning at about 8am, did not detect normal amount of movement, decided to present to MAU for evaluation.   OB History    Gravida  2   Para      Term      Preterm      AB  1   Living  0     SAB  1   TAB      Ectopic      Multiple      Live Births              Past Medical History:  Diagnosis Date  . Diabetes mellitus without complication ([place])   . Gestational diabetes     Past Surgical History:  Procedure Laterality Date  . NO PAST SURGERIES      Family History  Problem Relation Age of Onset  . Diabetes Mother   . Hypertension Mother     Social History   Tobacco Use  . Smoking status: Never Smoker  . Smokeless tobacco: Never Used  Substance Use Topics  . Alcohol use: No  . Drug use: No    Allergies: No Known Allergies  Medications Prior to Admission  Medication Sig Dispense Refill Last Dose  . aspirin EC 81 MG tablet Take 81 mg by mouth at bedtime.    12/26/2017 at Unknown time  . Fe Fum-FePoly-Vit C-Vit B3 (INTEGRA) 62.5-62.5-40-3 MG CAPS Take 1 capsule  by mouth daily.   12/27/2017 at Unknown time  . insulin NPH Human (HUMULIN N,NOVOLIN N) 100 UNIT/ML injection Inject 10-24 Units into the skin 2 (two) times daily. Patient uses 24 units in the morning and 10 units at bedtime.   12/27/2017 at Unknown time  . insulin regular (NOVOLIN R,HUMULIN R) 100 units/mL injection Inject 8-16 Units into the skin 2 (two) times daily before a meal. Patient uses 8 units before breakfast and 16 units before dinner.   12/27/2017 at Unknown time  . Prenatal Vit-Fe Fumarate-FA (PRENATAL MULTIVITAMIN) TABS tablet Take 1 tablet by mouth at bedtime.   12/26/2017 at Unknown time  . acetaminophen (TYLENOL) 500 MG tablet Take 1,000 mg by mouth every 6 (six) hours as needed for mild pain.   Unknown at Unknown time    Review of Systems  Constitutional: Negative for chills, fatigue and fever.  Gastrointestinal: Positive for abdominal pain. Negative for diarrhea, nausea and vomiting.  Genitourinary: Negative for difficulty urinating and flank pain.  Musculoskeletal: Negative for back pain.  Neurological: Negative for dizziness, weakness and headaches.  All other systems reviewed and are negative.  Physical Exam  Blood pressure 111/61, pulse 83, temperature 98.3 F (36.8 C), temperature source Oral, resp. rate 17, weight 75.8 kg, last menstrual period 04/01/2017, SpO2 100 %, unknown if currently breastfeeding.  Physical Exam  Nursing note and vitals reviewed. Constitutional: She is oriented to person, place, and time. She appears well-developed and well-nourished.  Cardiovascular: Normal rate.  Respiratory: Effort normal.  GI:  Gravid  Neurological: She is alert and oriented to person, place, and time. She has normal reflexes.  Skin: Skin is warm and dry.  Psychiatric: She has a normal mood and affect. Her behavior is normal. Judgment and thought content normal.    MAU Course/MDM  Prenatal records and fetal tracing reviewed  --Cervix  0.5/thick/posterior --Reactive fetal tracing: baseline 145, moderate variability, positive accelerations, no decelerations --Toco: irregular contractions, palpate mild, q 1.5-9 minutes --Active fetal movement noted throughout monitoring --Patient remains unable to detect fetal movement while Provider reviews fetal tracing  Patient Vitals for the past 24 hrs:  BP Temp Temp src Pulse Resp SpO2 Weight  12/27/17 1243 96/60 - - 78 18 - -  12/27/17 1147 111/61 98.3 F (36.8 C) Oral 83 17 100 % 75.8 kg     Assessment and Plan  --32 y.o. G2P0010 at [redacted]w[redacted]d  --Not laboring, cervix unchanged from 0.5/thick and very posterior/-3 --Reactive fetal tracing --Reviewed general obstetric precautions including but not limited to falls, fever, vaginal bleeding, leaking of fluid, decreased fetal movement, headache not relieved by Tylenol, rest and PO hydration. --Discharge home in stable condition  F/U: Patient has IOL scheduled for 01/01/18  Darlina Rumpf, CNM 12/27/2017, 1:19 PM

## 2017-12-27 NOTE — MAU Note (Addendum)
Julie Fleming is a 32 y.o. at [redacted]w[redacted]d here in MAU reporting: +decreased fetal movement. Some movement just less than normal. +contractions. Reports that they are lasting a long time before she gets relief. Pain score: 5/10 Vitals:   12/27/17 1147  BP: 111/61  Pulse: 83  Resp: 17  Temp: 98.3 F (36.8 C)  SpO2: 100%      Lab orders placed from triage: ua

## 2017-12-29 ENCOUNTER — Inpatient Hospital Stay (HOSPITAL_COMMUNITY): Payer: 59 | Admitting: Anesthesiology

## 2017-12-29 ENCOUNTER — Inpatient Hospital Stay (HOSPITAL_COMMUNITY)
Admission: AD | Admit: 2017-12-29 | Discharge: 2018-01-02 | DRG: 786 | Disposition: A | Discharge status: Home / Self Care | Payer: 59 | Attending: Obstetrics and Gynecology | Admitting: Obstetrics and Gynecology

## 2017-12-29 ENCOUNTER — Encounter (HOSPITAL_COMMUNITY): Payer: Self-pay

## 2017-12-29 DIAGNOSIS — O4103X0 Oligohydramnios, third trimester, not applicable or unspecified: Secondary | ICD-10-CM | POA: Diagnosis present

## 2017-12-29 DIAGNOSIS — O24424 Gestational diabetes mellitus in childbirth, insulin controlled: Secondary | ICD-10-CM | POA: Diagnosis present

## 2017-12-29 DIAGNOSIS — Z3A38 38 weeks gestation of pregnancy: Secondary | ICD-10-CM | POA: Diagnosis not present

## 2017-12-29 DIAGNOSIS — O368130 Decreased fetal movements, third trimester, not applicable or unspecified: Secondary | ICD-10-CM | POA: Diagnosis present

## 2017-12-29 DIAGNOSIS — O24419 Gestational diabetes mellitus in pregnancy, unspecified control: Secondary | ICD-10-CM | POA: Diagnosis present

## 2017-12-29 DIAGNOSIS — O411230 Chorioamnionitis, third trimester, not applicable or unspecified: Secondary | ICD-10-CM | POA: Diagnosis present

## 2017-12-29 DIAGNOSIS — Z98891 History of uterine scar from previous surgery: Secondary | ICD-10-CM

## 2017-12-29 LAB — GLUCOSE, CAPILLARY
GLUCOSE-CAPILLARY: 87 mg/dL (ref 70–99)
GLUCOSE-CAPILLARY: 63 mg/dL — AB (ref 70–99)
GLUCOSE-CAPILLARY: 75 mg/dL (ref 70–99)
GLUCOSE-CAPILLARY: 66 mg/dL — AB (ref 70–99)
Glucose-Capillary: 75 mg/dL (ref 70–99)
Glucose-Capillary: 73 mg/dL (ref 70–99)

## 2017-12-29 LAB — CBC
HCT: 36.3 % (ref 36.0–46.0)
HEMOGLOBIN: 11.8 g/dL — AB (ref 12.0–15.0)
MCH: 27.2 pg (ref 26.0–34.0)
MCHC: 32.5 g/dL (ref 30.0–36.0)
MCV: 83.6 fL (ref 80.0–100.0)
Platelets: 267 10*3/uL (ref 150–400)
RBC: 4.34 MIL/uL (ref 3.87–5.11)
RDW: 16.1 % — ABNORMAL HIGH (ref 11.5–15.5)
WBC: 9.6 10*3/uL (ref 4.0–10.5)
nRBC: 0 % (ref 0.0–0.2)

## 2017-12-29 LAB — TYPE AND SCREEN
ABO/RH(D): B POS
Antibody Screen: NEGATIVE

## 2017-12-29 MED ORDER — OXYTOCIN BOLUS FROM INFUSION: 500.0000 mL | Freq: Once | INTRAVENOUS | Status: DC

## 2017-12-29 MED ORDER — PHENYLEPHRINE 40 MCG/ML (10ML) SYRINGE FOR IV PUSH (FOR BLOOD PRESSURE SUPPORT)
80.0000 ug | PREFILLED_SYRINGE | INTRAVENOUS | Status: DC | PRN
  Filled 2017-12-29 (×2): qty 10

## 2017-12-29 MED ORDER — LACTATED RINGERS IV SOLN
500.0000 mL | Freq: Once | INTRAVENOUS | Status: AC
  Administered 2017-12-29: 500 mL via INTRAVENOUS

## 2017-12-29 MED ORDER — OXYCODONE-ACETAMINOPHEN 5-325 MG PO TABS: 1.0000 | ORAL_TABLET | ORAL | Status: DC | PRN

## 2017-12-29 MED ORDER — LACTATED RINGERS IV SOLN
INTRAVENOUS | Status: DC
  Administered 2017-12-29 – 2017-12-30 (×4): via INTRAVENOUS

## 2017-12-29 MED ORDER — PHENYLEPHRINE 40 MCG/ML (10ML) SYRINGE FOR IV PUSH (FOR BLOOD PRESSURE SUPPORT): 80.0000 ug | PREFILLED_SYRINGE | INTRAVENOUS | Status: DC | PRN

## 2017-12-29 MED ORDER — FENTANYL 2.5 MCG/ML BUPIVACAINE 1/10 % EPIDURAL INFUSION (WH - ANES)
14.0000 mL/h | INTRAMUSCULAR | Status: DC | PRN
  Administered 2017-12-29 – 2017-12-30 (×3): 14 mL/h via EPIDURAL
  Filled 2017-12-29 (×3): qty 100

## 2017-12-29 MED ORDER — OXYTOCIN 40 UNITS IN LACTATED RINGERS INFUSION - SIMPLE MED
2.5000 [IU]/h | INTRAVENOUS | Status: DC
  Filled 2017-12-29: qty 1000

## 2017-12-29 MED ORDER — MISOPROSTOL 25 MCG QUARTER TABLET
25.0000 ug | ORAL_TABLET | ORAL | Status: DC | PRN
  Administered 2017-12-29: 25 ug via VAGINAL
  Filled 2017-12-29: qty 1

## 2017-12-29 MED ORDER — DIPHENHYDRAMINE HCL 50 MG/ML IJ SOLN: 12.5000 mg | INTRAMUSCULAR | Status: DC | PRN

## 2017-12-29 MED ORDER — OXYCODONE-ACETAMINOPHEN 5-325 MG PO TABS: 2.0000 | ORAL_TABLET | ORAL | Status: DC | PRN

## 2017-12-29 MED ORDER — EPHEDRINE 5 MG/ML INJ: 10.0000 mg | INTRAVENOUS | Status: DC | PRN

## 2017-12-29 MED ORDER — HYDROXYZINE HCL 50 MG PO TABS: 50.0000 mg | ORAL_TABLET | Freq: Four times a day (QID) | ORAL | Status: DC | PRN

## 2017-12-29 MED ORDER — ZOLPIDEM TARTRATE 5 MG PO TABS: 5.0000 mg | ORAL_TABLET | Freq: Every evening | ORAL | Status: DC | PRN

## 2017-12-29 MED ORDER — SOD CITRATE-CITRIC ACID 500-334 MG/5ML PO SOLN
30.0000 mL | ORAL | Status: DC | PRN
  Administered 2017-12-30: 30 mL via ORAL
  Filled 2017-12-29: qty 15

## 2017-12-29 MED ORDER — ONDANSETRON HCL 4 MG/2ML IJ SOLN: 4.0000 mg | Freq: Four times a day (QID) | INTRAMUSCULAR | Status: DC | PRN

## 2017-12-29 MED ORDER — LIDOCAINE HCL (PF) 1 % IJ SOLN
INTRAMUSCULAR | Status: DC | PRN
  Administered 2017-12-29: 10 mL via EPIDURAL
  Administered 2017-12-30: 5 mL via EPIDURAL

## 2017-12-29 MED ORDER — BUTORPHANOL TARTRATE 1 MG/ML IJ SOLN
1.0000 mg | INTRAMUSCULAR | Status: DC | PRN
  Administered 2017-12-29: 1 mg via INTRAVENOUS
  Filled 2017-12-29: qty 1

## 2017-12-29 MED ORDER — LACTATED RINGERS IV SOLN: 500.0000 mL | INTRAVENOUS | Status: DC | PRN

## 2017-12-29 MED ORDER — LIDOCAINE HCL (PF) 1 % IJ SOLN
30.0000 mL | INTRAMUSCULAR | Status: DC | PRN
  Filled 2017-12-29: qty 30

## 2017-12-29 MED ORDER — TERBUTALINE SULFATE 1 MG/ML IJ SOLN: 0.2500 mg | Freq: Once | INTRAMUSCULAR | Status: DC | PRN

## 2017-12-29 MED ORDER — ACETAMINOPHEN 325 MG PO TABS
650.0000 mg | ORAL_TABLET | ORAL | Status: DC | PRN
  Administered 2017-12-29 – 2017-12-30 (×2): 650 mg via ORAL
  Filled 2017-12-29 (×2): qty 2

## 2017-12-29 NOTE — Anesthesia Preprocedure Evaluation (Signed)
Anesthesia Evaluation  Patient identified by MRN, date of birth, ID band Patient awake    Reviewed: Allergy & Precautions, H&P , NPO status , Patient's Chart, lab work & pertinent test results  History of Anesthesia Complications Negative for: history of anesthetic complications  Airway Mallampati: II  TM Distance: >3 FB Neck ROM: full    Dental no notable dental hx.    Pulmonary neg pulmonary ROS,    Pulmonary exam normal        Cardiovascular negative cardio ROS Normal cardiovascular exam Rhythm:regular Rate:Normal     Neuro/Psych negative neurological ROS  negative psych ROS   GI/Hepatic negative GI ROS, Neg liver ROS,   Endo/Other  diabetes, Gestational  Renal/GU negative Renal ROS  negative genitourinary   Musculoskeletal   Abdominal   Peds  Hematology negative hematology ROS (+)   Anesthesia Other Findings   Reproductive/Obstetrics (+) Pregnancy                             Anesthesia Physical Anesthesia Plan  ASA: II  Anesthesia Plan: Epidural   Post-op Pain Management:    Induction:   PONV Risk Score and Plan:   Airway Management Planned:   Additional Equipment:   Intra-op Plan:   Post-operative Plan:   Informed Consent: I have reviewed the patients History and Physical, chart, labs and discussed the procedure including the risks, benefits and alternatives for the proposed anesthesia with the patient or authorized representative who has indicated his/her understanding and acceptance.       Plan Discussed with:   Anesthesia Plan Comments:         Anesthesia Quick Evaluation  

## 2017-12-29 NOTE — Anesthesia Pain Management Evaluation Note (Signed)
  CRNA Pain Management Visit Note  Patient: Julie Fleming, 32 y.o., female  "Hello I am a member of the anesthesia team at [facility]. We have an anesthesia team available at all times to provide care throughout the hospital, including epidural management and anesthesia for C-section. I don't know your plan for the delivery whether it a natural birth, water birth, IV sedation, nitrous supplementation, doula or epidural, but we want to meet your pain goals."   1.Was your pain managed to your expectations on prior hospitalizations?   Did not ask  2.What is your expectation for pain management during this hospitalization?     Epidural  3.How can we help you reach that goal? Epidural in place and is working well.  Record the patient's initial score and the patient's pain goal.   Pain: 1  Pain Goal: 4 [facility] wants you to be able to say your pain was always managed very well.  Kennadee Walthour 12/29/2017

## 2017-12-29 NOTE — Anesthesia Procedure Notes (Signed)
Epidural Patient location during procedure: OB Start time: 12/29/2017 5:12 PM End time: 12/29/2017 5:26 PM  Staffing Anesthesiologist: Lidia Collum, MD Performed: anesthesiologist   Preanesthetic Checklist Completed: patient identified, pre-op evaluation, timeout performed, IV checked, risks and benefits discussed and monitors and equipment checked  Epidural Patient position: sitting Prep: DuraPrep Patient monitoring: heart rate, continuous pulse ox and blood pressure Approach: midline Location: L3-L4 Injection technique: LOR saline  Needle:  Needle type: Tuohy  Needle gauge: 17 G Needle length: 9 cm Needle insertion depth: 5 cm Catheter type: closed end flexible Catheter size: 19 Gauge Catheter at skin depth: 10 cm  Assessment Events: blood not aspirated, injection not painful, no injection resistance, negative IV test and no paresthesia  Additional Notes Reason for block:procedure for pain

## 2017-12-29 NOTE — Progress Notes (Signed)
Subjective: Pt resting comfortably with epidural, unable to feel ctxns.   Objective: BP 111/83   Pulse 69   Temp 98 F (36.7 C) (Oral)   Resp 18   Ht 5\' 2"  (1.575 m)   Wt 76.4 kg   LMP 04/01/2017   SpO2 98%   BMI 30.82 kg/m   FHT: 130s, minimal variability, no accels, no decels UC:   3-5 in 10 minutes SVE:   Dilation: 3.5 Effacement (%): 60 Station: -2 Exam by:: Arbie Cookey CNM  Pt consented to AROM after discussion of risks and benefits at 2030 with scant fluid return, bloody, no odor. IUPC placed.  Assessment:  Early labor, pain well controlled. Category 2 tracing.  Plan: Monitor tracing and evaluate for effective ctnx pattern. Augment as needed. Anticipate SVD.  Altha Harm CNM, MSN 12/29/2017, 8:49 PM

## 2017-12-29 NOTE — Progress Notes (Addendum)
Subjective: Pt rates pain 4/10 with epidural in place.  Objective: BP (!) 108/59   Pulse 80   Temp 97.8 F (36.6 C) (Oral)   Resp 18   Ht 5\' 2"  (1.575 m)   Wt 76.4 kg   LMP 04/01/2017   SpO2 98%   BMI 30.82 kg/m  Total I/O In: -  Out: 1300 [Urine:1300]  FHT: 135, moderate with accels, no decels. UCs: 4-5 in 10 minutes SVE:   Deferred (last exam 3.5/60/-2)  Assessment: Early labor Membranes ruptured x 2h Cat 1 tracing   Plan: Continue to monitor closely. Anticipate SVD.  Altha Harm, CNM 12/29/2017, 10:32 PM

## 2017-12-29 NOTE — H&P (Signed)
Julie Fleming is a 31 y.o. female G2 P0010 @ 26 6/7 weeks c/w 6 week ultrasound was sent in for IOL due to nonreassuring fetal testing.   Pt was seen for routine BPP in our office due to GDM A2 well controlled on Insulin. BPP was 2/8 (+breathing), AFI 2.1 cm.  Pt also reported decreased fetal movement this morning in the office but since admission, fetal movement has increased. Pt sent immediately to the hospital which on arrival NST showed accelerations. Pt's prenatal care with [facility] Simona Huh) has been complicated as above.    OB History    Gravida  2   Para      Term      Preterm      AB  1   Living  0     SAB  1   TAB      Ectopic      Multiple      Live Births             Past Medical History:  Diagnosis Date  . Diabetes mellitus without complication ([place])   . Gestational diabetes    Past Surgical History:  Procedure Laterality Date  . NO PAST SURGERIES     Family History: family history includes Diabetes in her mother; Hypertension in her mother. Social History:  reports that she has never smoked. She has never used smokeless tobacco. She reports that she does not drink alcohol or use drugs.     Maternal Diabetes: Yes:  Diabetes Type:  Insulin/Medication controlled Genetic Screening: Normal Maternal Ultrasounds/Referrals: Normal Fetal Ultrasounds or other Referrals:  None Maternal Substance Abuse:  No Significant Maternal Medications:  Meds include: Other: Insulin Significant Maternal Lab Results:  Lab values include: Group B Strep negative Other Comments:  Oligohydramnios  Review of Systems  Gastrointestinal: Negative for abdominal pain.  Neurological: Negative for headaches.   Maternal Medical History:  Contractions: Frequency: rare.    Fetal activity: Perceived fetal activity is decreased.    Prenatal Complications - Diabetes: Diabetes is managed by diet and insulin injections.      Dilation: Fingertip Effacement (%):  Thick Station: -2 Exam by:: erin davis rn Blood pressure 112/69, pulse 78, temperature 98.1 F (36.7 C), temperature source Oral, resp. rate 18, height 5\' 2"  (1.575 m), weight 76.4 kg, last menstrual period 04/01/2017, unknown if currently breastfeeding. Maternal Exam:  Uterine Assessment: Contraction strength is mild.  Contraction frequency is irregular.   Abdomen: Patient reports no abdominal tenderness. Estimated fetal weight is 7 lb 11 oz, +/- 7 oz.   Fetal presentation: vertex  Pelvis: adequate for delivery.   Pelvis previously assessed. Cervix: FT per RN.  Fetal Exam Fetal Monitor Review: Mode: fetoscope.   Baseline rate: 140.  Variability: moderate (6-25 bpm).   Pattern: accelerations present.    Fetal State Assessment: Category I - tracings are normal.     Physical Exam  Constitutional: She is oriented to person, place, and time. She appears well-developed.  HENT:  Head: Normocephalic and atraumatic.  Eyes: EOM are normal.  Neck: Normal range of motion.  Respiratory: Effort normal. No respiratory distress.  GI: There is no tenderness.  Musculoskeletal: Normal range of motion. She exhibits edema.  Neurological: She is alert and oriented to person, place, and time.  Skin: Skin is warm and dry.  Psychiatric: She has a normal mood and affect.    Prenatal labs: ABO, Rh: --/--/B POS (11/18 1118) Antibody: NEG (11/18 1118) Rubella: Immune (04/02 0000) RPR:  Nonreactive (04/02 0000)  HBsAg: Negative (04/02 0000)  HIV: Non-reactive (04/02 0000)  GBS: Negative (10/26 0000)   Assessment/Plan: IUP @ 38 6/7 weeks Nonreassuring BPP but now reassuring NST.  Category I tracing. Induce labor with Cytotec.  D/w pt and family possible fetal intolerance to labor requiring c-section.  Informed of possible use foley bulb or Pitocin prn. Gest DM-normal blood glucose on admission  Non macrosomic infant.  CBG q 4 hours while in latent labor.  Glucostabilizer in active labor  prn. Pt informed that Dr. Nelda Marseille covering until 7 pm, [facility] with midwife 7 pm to 7 am.    Thurnell Lose 12/29/2017, 2:35 PM

## 2017-12-30 ENCOUNTER — Encounter (HOSPITAL_COMMUNITY): Payer: Self-pay | Admitting: Obstetrics and Gynecology

## 2017-12-30 ENCOUNTER — Encounter (HOSPITAL_COMMUNITY)
Admission: AD | Disposition: A | Discharge status: Home / Self Care | Payer: Self-pay | Source: Home / Self Care | Attending: Obstetrics and Gynecology

## 2017-12-30 LAB — GLUCOSE, CAPILLARY
GLUCOSE-CAPILLARY: 82 mg/dL (ref 70–99)
Glucose-Capillary: 69 mg/dL — ABNORMAL LOW (ref 70–99)
Glucose-Capillary: 74 mg/dL (ref 70–99)
Glucose-Capillary: 94 mg/dL (ref 70–99)

## 2017-12-30 LAB — RPR: RPR Ser Ql: NONREACTIVE

## 2017-12-30 SURGERY — Surgical Case
Anesthesia: Epidural | Site: Abdomen | Wound class: Clean Contaminated

## 2017-12-30 MED ORDER — ONDANSETRON HCL 4 MG/2ML IJ SOLN
INTRAMUSCULAR | Status: AC
  Filled 2017-12-30: qty 2

## 2017-12-30 MED ORDER — OXYTOCIN 40 UNITS IN LACTATED RINGERS INFUSION - SIMPLE MED
1.0000 m[IU]/min | INTRAVENOUS | Status: DC
  Administered 2017-12-30: 1 m[IU]/min via INTRAVENOUS

## 2017-12-30 MED ORDER — ENOXAPARIN SODIUM 40 MG/0.4ML ~~LOC~~ SOLN
40.0000 mg | SUBCUTANEOUS | Status: DC
  Administered 2017-12-31 – 2018-01-02 (×3): 40 mg via SUBCUTANEOUS
  Filled 2017-12-30 (×3): qty 0.4

## 2017-12-30 MED ORDER — WITCH HAZEL-GLYCERIN EX PADS: 1.0000 "application " | MEDICATED_PAD | CUTANEOUS | Status: DC | PRN

## 2017-12-30 MED ORDER — DIPHENHYDRAMINE HCL 25 MG PO CAPS
25.0000 mg | ORAL_CAPSULE | Freq: Four times a day (QID) | ORAL | Status: DC | PRN
  Administered 2017-12-30 – 2017-12-31 (×2): 25 mg via ORAL
  Filled 2017-12-30 (×3): qty 1

## 2017-12-30 MED ORDER — GENTAMICIN SULFATE 40 MG/ML IJ SOLN
INTRAVENOUS | Status: AC
  Administered 2017-12-30: 100 mg via INTRAVENOUS
  Filled 2017-12-30: qty 7.5

## 2017-12-30 MED ORDER — OXYTOCIN 40 UNITS IN LACTATED RINGERS INFUSION - SIMPLE MED: 2.5000 [IU]/h | INTRAVENOUS | Status: AC

## 2017-12-30 MED ORDER — PHENYLEPHRINE 40 MCG/ML (10ML) SYRINGE FOR IV PUSH (FOR BLOOD PRESSURE SUPPORT)
PREFILLED_SYRINGE | INTRAVENOUS | Status: AC
  Filled 2017-12-30: qty 10

## 2017-12-30 MED ORDER — LACTATED RINGERS IV SOLN
INTRAVENOUS | Status: DC | PRN
  Administered 2017-12-30 (×2): via INTRAVENOUS

## 2017-12-30 MED ORDER — SODIUM CHLORIDE 0.9 % IV SOLN
3.0000 g | Freq: Four times a day (QID) | INTRAVENOUS | Status: DC
  Administered 2017-12-30: 3 g via INTRAVENOUS
  Filled 2017-12-30 (×2): qty 3

## 2017-12-30 MED ORDER — SODIUM BICARBONATE 8.4 % IV SOLN
INTRAVENOUS | Status: AC
  Filled 2017-12-30: qty 50

## 2017-12-30 MED ORDER — FERROUS SULFATE 325 (65 FE) MG PO TABS
325.0000 mg | ORAL_TABLET | Freq: Two times a day (BID) | ORAL | Status: DC
  Administered 2017-12-30 – 2018-01-02 (×5): 325 mg via ORAL
  Filled 2017-12-30 (×6): qty 1

## 2017-12-30 MED ORDER — SODIUM CHLORIDE 0.9 % IV SOLN
3.0000 g | Freq: Four times a day (QID) | INTRAVENOUS | Status: AC
  Administered 2017-12-30 – 2017-12-31 (×4): 3 g via INTRAVENOUS
  Filled 2017-12-30 (×4): qty 3

## 2017-12-30 MED ORDER — LACTATED RINGERS IV SOLN
INTRAVENOUS | Status: DC
  Administered 2017-12-30 – 2017-12-31 (×2): via INTRAVENOUS

## 2017-12-30 MED ORDER — PRENATAL MULTIVITAMIN CH
1.0000 | ORAL_TABLET | Freq: Every day | ORAL | Status: DC
  Administered 2017-12-31 – 2018-01-02 (×3): 1 via ORAL
  Filled 2017-12-30 (×5): qty 1

## 2017-12-30 MED ORDER — ZOLPIDEM TARTRATE 5 MG PO TABS: 5.0000 mg | ORAL_TABLET | Freq: Every evening | ORAL | Status: DC | PRN

## 2017-12-30 MED ORDER — MORPHINE SULFATE (PF) 0.5 MG/ML IJ SOLN
INTRAMUSCULAR | Status: DC | PRN
  Administered 2017-12-30: 3 mg via EPIDURAL

## 2017-12-30 MED ORDER — PHENYLEPHRINE HCL 10 MG/ML IJ SOLN
INTRAMUSCULAR | Status: DC | PRN
  Administered 2017-12-30: 80 ug via INTRAVENOUS

## 2017-12-30 MED ORDER — SODIUM CHLORIDE 0.9 % IR SOLN
Status: DC | PRN
  Administered 2017-12-30: 1000 mL

## 2017-12-30 MED ORDER — SENNOSIDES-DOCUSATE SODIUM 8.6-50 MG PO TABS
2.0000 | ORAL_TABLET | ORAL | Status: DC
  Administered 2017-12-31 – 2018-01-01 (×3): 2 via ORAL
  Filled 2017-12-30 (×5): qty 2

## 2017-12-30 MED ORDER — MENTHOL 3 MG MT LOZG: 1.0000 | LOZENGE | OROMUCOSAL | Status: DC | PRN

## 2017-12-30 MED ORDER — TERBUTALINE SULFATE 1 MG/ML IJ SOLN: 0.2500 mg | Freq: Once | INTRAMUSCULAR | Status: DC | PRN

## 2017-12-30 MED ORDER — SIMETHICONE 80 MG PO CHEW
80.0000 mg | CHEWABLE_TABLET | ORAL | Status: DC
  Administered 2017-12-31 – 2018-01-01 (×3): 80 mg via ORAL
  Filled 2017-12-30 (×5): qty 1

## 2017-12-30 MED ORDER — SIMETHICONE 80 MG PO CHEW
80.0000 mg | CHEWABLE_TABLET | Freq: Three times a day (TID) | ORAL | Status: DC
  Administered 2017-12-30 – 2018-01-02 (×9): 80 mg via ORAL
  Filled 2017-12-30 (×14): qty 1

## 2017-12-30 MED ORDER — METHYLERGONOVINE MALEATE 0.2 MG PO TABS: 0.2000 mg | ORAL_TABLET | ORAL | Status: DC | PRN

## 2017-12-30 MED ORDER — SCOPOLAMINE 1 MG/3DAYS TD PT72
MEDICATED_PATCH | TRANSDERMAL | Status: AC
  Filled 2017-12-30: qty 1

## 2017-12-30 MED ORDER — OXYTOCIN 10 UNIT/ML IJ SOLN
INTRAVENOUS | Status: DC | PRN
  Administered 2017-12-30: 40 [IU] via INTRAVENOUS

## 2017-12-30 MED ORDER — FENTANYL CITRATE (PF) 100 MCG/2ML IJ SOLN
INTRAMUSCULAR | Status: AC
  Filled 2017-12-30: qty 2

## 2017-12-30 MED ORDER — OXYCODONE HCL 5 MG PO TABS: 10.0000 mg | ORAL_TABLET | ORAL | Status: DC | PRN

## 2017-12-30 MED ORDER — METHYLERGONOVINE MALEATE 0.2 MG/ML IJ SOLN: 0.2000 mg | INTRAMUSCULAR | Status: DC | PRN

## 2017-12-30 MED ORDER — LACTATED RINGERS IV SOLN
INTRAVENOUS | Status: DC | PRN
  Administered 2017-12-30: 10:00:00 via INTRAVENOUS

## 2017-12-30 MED ORDER — MORPHINE SULFATE (PF) 0.5 MG/ML IJ SOLN
INTRAMUSCULAR | Status: AC
  Filled 2017-12-30: qty 10

## 2017-12-30 MED ORDER — BUPIVACAINE HCL (PF) 0.25 % IJ SOLN
INTRAMUSCULAR | Status: DC | PRN
  Administered 2017-12-30: 8 mL via EPIDURAL

## 2017-12-30 MED ORDER — ACETAMINOPHEN 325 MG PO TABS
650.0000 mg | ORAL_TABLET | ORAL | Status: DC | PRN
  Administered 2017-12-30 – 2018-01-01 (×4): 650 mg via ORAL
  Filled 2017-12-30 (×4): qty 2

## 2017-12-30 MED ORDER — TETANUS-DIPHTH-ACELL PERTUSSIS 5-2.5-18.5 LF-MCG/0.5 IM SUSP: 0.5000 mL | Freq: Once | INTRAMUSCULAR | Status: DC

## 2017-12-30 MED ORDER — DIBUCAINE 1 % RE OINT: 1.0000 "application " | TOPICAL_OINTMENT | RECTAL | Status: DC | PRN

## 2017-12-30 MED ORDER — OXYTOCIN 10 UNIT/ML IJ SOLN
INTRAMUSCULAR | Status: AC
  Filled 2017-12-30: qty 4

## 2017-12-30 MED ORDER — LIDOCAINE-EPINEPHRINE (PF) 2 %-1:200000 IJ SOLN
INTRAMUSCULAR | Status: AC
  Filled 2017-12-30: qty 20

## 2017-12-30 MED ORDER — STERILE WATER FOR IRRIGATION IR SOLN
Status: DC | PRN
  Administered 2017-12-30: 1000 mL

## 2017-12-30 MED ORDER — OXYCODONE HCL 5 MG PO TABS
5.0000 mg | ORAL_TABLET | ORAL | Status: DC | PRN
  Administered 2017-12-31 – 2018-01-02 (×6): 5 mg via ORAL
  Filled 2017-12-30 (×7): qty 1

## 2017-12-30 MED ORDER — LIDOCAINE-EPINEPHRINE (PF) 2 %-1:200000 IJ SOLN
INTRAMUSCULAR | Status: DC | PRN
  Administered 2017-12-30: 7 mL via EPIDURAL
  Administered 2017-12-30: 5 mL via EPIDURAL
  Administered 2017-12-30: 2 mL via EPIDURAL

## 2017-12-30 MED ORDER — COCONUT OIL OIL
1.0000 "application " | TOPICAL_OIL | Status: DC | PRN
  Administered 2018-01-01: 1 via TOPICAL
  Filled 2017-12-30: qty 120

## 2017-12-30 MED ORDER — IBUPROFEN 600 MG PO TABS
600.0000 mg | ORAL_TABLET | Freq: Four times a day (QID) | ORAL | Status: DC
  Administered 2017-12-30 – 2018-01-02 (×12): 600 mg via ORAL
  Filled 2017-12-30 (×12): qty 1

## 2017-12-30 MED ORDER — ONDANSETRON HCL 4 MG/2ML IJ SOLN
INTRAMUSCULAR | Status: DC | PRN
  Administered 2017-12-30: 4 mg via INTRAVENOUS

## 2017-12-30 MED ORDER — SIMETHICONE 80 MG PO CHEW
80.0000 mg | CHEWABLE_TABLET | ORAL | Status: DC | PRN
  Filled 2017-12-30: qty 1

## 2017-12-30 MED ORDER — FENTANYL CITRATE (PF) 100 MCG/2ML IJ SOLN
INTRAMUSCULAR | Status: DC | PRN
  Administered 2017-12-30: 100 ug via EPIDURAL

## 2017-12-30 MED ORDER — DEXTROSE-NACL 5-0.45 % IV SOLN
Freq: Once | INTRAVENOUS | Status: AC
  Administered 2017-12-30: 06:00:00 via INTRAVENOUS

## 2017-12-30 MED ORDER — FENTANYL CITRATE (PF) 100 MCG/2ML IJ SOLN: 100.0000 ug | Freq: Once | INTRAMUSCULAR | Status: DC

## 2017-12-30 SURGICAL SUPPLY — 38 items
BARRIER ADHS 3X4 INTERCEED (GAUZE/BANDAGES/DRESSINGS) ×3 IMPLANT
BENZOIN TINCTURE PRP APPL 2/3 (GAUZE/BANDAGES/DRESSINGS) ×3 IMPLANT
CHLORAPREP W/TINT 26ML (MISCELLANEOUS) ×3 IMPLANT
CLAMP CORD UMBIL (MISCELLANEOUS) IMPLANT
CLOSURE STERI STRIP 1/2 X4 (GAUZE/BANDAGES/DRESSINGS) ×3 IMPLANT
CLOSURE WOUND 1/2 X4 (GAUZE/BANDAGES/DRESSINGS)
CLOTH BEACON ORANGE TIMEOUT ST (SAFETY) ×3 IMPLANT
DERMABOND ADVANCED (GAUZE/BANDAGES/DRESSINGS)
DERMABOND ADVANCED .7 DNX12 (GAUZE/BANDAGES/DRESSINGS) IMPLANT
DRSG OPSITE POSTOP 4X10 (GAUZE/BANDAGES/DRESSINGS) ×3 IMPLANT
ELECT REM PT RETURN 9FT ADLT (ELECTROSURGICAL) ×3
ELECTRODE REM PT RTRN 9FT ADLT (ELECTROSURGICAL) ×1 IMPLANT
EXTRACTOR VACUUM BELL STYLE (SUCTIONS) IMPLANT
GAUZE SPONGE 4X4 12PLY STRL LF (GAUZE/BANDAGES/DRESSINGS) ×6 IMPLANT
GLOVE BIO SURGEON STRL SZ7 (GLOVE) ×3 IMPLANT
GLOVE BIOGEL PI IND STRL 7.0 (GLOVE) ×2 IMPLANT
GLOVE BIOGEL PI INDICATOR 7.0 (GLOVE) ×4
GOWN STRL REUS W/TWL LRG LVL3 (GOWN DISPOSABLE) ×6 IMPLANT
KIT ABG SYR 3ML LUER SLIP (SYRINGE) IMPLANT
NEEDLE HYPO 25X5/8 SAFETYGLIDE (NEEDLE) IMPLANT
NS IRRIG 1000ML POUR BTL (IV SOLUTION) ×3 IMPLANT
PACK C SECTION WH (CUSTOM PROCEDURE TRAY) ×3 IMPLANT
PAD ABD 7.5X8 STRL (GAUZE/BANDAGES/DRESSINGS) ×3 IMPLANT
PAD OB MATERNITY 4.3X12.25 (PERSONAL CARE ITEMS) ×3 IMPLANT
PENCIL SMOKE EVAC W/HOLSTER (ELECTROSURGICAL) ×3 IMPLANT
RTRCTR C-SECT PINK 25CM LRG (MISCELLANEOUS) ×3 IMPLANT
SPONGE LAP 18X18 RF (DISPOSABLE) ×15 IMPLANT
STRIP CLOSURE SKIN 1/2X4 (GAUZE/BANDAGES/DRESSINGS) IMPLANT
SUT CHROMIC 0 CTX 36 (SUTURE) IMPLANT
SUT MON AB 4-0 PS1 27 (SUTURE) ×3 IMPLANT
SUT PLAIN 0 NONE (SUTURE) IMPLANT
SUT PLAIN 2 0 XLH (SUTURE) ×3 IMPLANT
SUT VIC AB 0 CTX 36 (SUTURE) ×10
SUT VIC AB 0 CTX36XBRD ANBCTRL (SUTURE) ×5 IMPLANT
SUT VIC AB 2-0 CT1 27 (SUTURE) ×2
SUT VIC AB 2-0 CT1 TAPERPNT 27 (SUTURE) ×1 IMPLANT
TOWEL OR 17X24 6PK STRL BLUE (TOWEL DISPOSABLE) ×3 IMPLANT
TRAY FOLEY W/BAG SLVR 14FR LF (SET/KITS/TRAYS/PACK) IMPLANT

## 2017-12-30 NOTE — Progress Notes (Signed)
Pharmacy Antibiotic Note  Julie Fleming is a 32 y.o. female admitted on 12/29/2017 for induction, now with presumed chorioamnionitis. Pharmacy has been consulted for unasyn dosing.  Plan: Unasyn  3 grams IV Q6  Height: 5\' 2"  (157.5 cm) Weight: 168 lb 8 oz (76.4 kg) IBW/kg (Calculated) : 50.1  Temp (24hrs), Avg:98.2 F (36.8 C), Min:97.8 F (36.6 C), Max:100.2 F (37.9 C)  Recent Labs  Lab 12/29/17 1118  WBC 9.6    CrCl cannot be calculated (No successful lab value found.).    No Known Allergies  Antimicrobials this admission: Unasyn 3 grams Q6 11/19 >>   Thank you for allowing pharmacy to be a part of this patient's care.  Nyra Capes 12/30/2017 7:59 AM

## 2017-12-30 NOTE — Progress Notes (Signed)
In room at 9 am to reassess.  Pt complete x 3 hours.  Pushing with minimal descent per RN. Pt checked and descends to +1-+2 with  Pushing.  Counseled on C-section vs. VAVD.  Couple ok with trial of vacuum with the understanding that we will abort if no descent. Fetal tracing improved with stimulation of head and was reassuring. Vacuum x 3.  Descent noted but slowly floated back up.  Reassess position, LOP prior to last. Pull.  Due to turtling and OP presentation, CPD suspected.  Pt and husband counseled on c-section risks.  Pt recently had Unasyn with fetal HR return to 150s.  Gentamycin and Clindamycin on call the OR. Vagina inspected due to bleeding.  No active bleeding, small laceration on right vagina responded to tamponade.

## 2017-12-30 NOTE — Lactation Note (Signed)
This note was copied from a baby's chart. Lactation Consultation Note  Patient Name: Girl Katy Brickell Today's Date: 12/30/2017 Reason for consult: Initial assessment;1st time breastfeeding;Primapara;Term;Maternal endocrine disorder Type of Endocrine Disorder?: Diabetes    Initial consult with first time mom of 49 hour old infant. Infant with 3 BF for 20-25 minutes, LATCH score 8. Infant weight 7 pounds 5.8 ounces.   Infant awake and cueing to feed, mom asked for assistance with latch. Assisted mom in latching infant to the left breast in the cross cradle hold, infant had difficulty sustaining latch due to mom wanting to lay down for feeding. Switched infant to the football hold and she latched better. She fed and released. Infant then latched to the right breast in the cross cradle hold and fed well. Infant with intermittent swallows. Enc mom to stimulate infant as needed to maintain active feeding and to massage/intermittently compress breast with feeding. Infant still feeding when Gazelle left the room.   Enc mom to feed infant STS 8-12 x in 24 hours at first feeding cues, offering both breast with each feeding. Enc mom to allow infant to feed as long as she wants. Reviewed normal NB feeding behavior, cluster feeding, colostrum, milk coming to volume and cluster feeding.   BF Resources handout and [place] Brochure given, mom informed of IP/OP Services, BF Support Groups and [place] phone #. Enc mom to call out for feeding assistance as needed. Mom reports all questions have been answered at this time.. Report to Seymour Bars, RN.    Maternal Data Formula Feeding for Exclusion: No Has patient been taught Hand Expression?: Yes Does the patient have breastfeeding experience prior to this delivery?: No  Feeding Feeding Type: Breast Fed  LATCH Score Latch: Repeated attempts needed to sustain latch, nipple held in mouth throughout feeding, stimulation needed to elicit sucking reflex.  Audible  Swallowing: A few with stimulation  Type of Nipple: Everted at rest and after stimulation  Comfort (Breast/Nipple): Soft / non-tender  Hold (Positioning): Assistance needed to correctly position infant at breast and maintain latch.  LATCH Score: 7  Interventions Interventions: Breast feeding basics reviewed;Support pillows;Assisted with latch;Position options;Skin to skin;Breast massage;Breast compression;Hand express;Adjust position  Lactation Tools Discussed/Used WIC Program: No   Consult Status Consult Status: Follow-up Date: 12/31/17 Follow-up type: In-patient    Debby Freiberg Jermario Kalmar 12/30/2017, 5:04 PM

## 2017-12-30 NOTE — Progress Notes (Signed)
Subjective: Pt was resting in bed, on back to relieve back pain.  Objective: BP 119/68   Pulse 80   Temp 97.9 F (36.6 C) (Oral)   Resp 18   Ht 5\' 2"  (1.575 m)   Wt 76.4 kg   LMP 04/01/2017   SpO2 97%   BMI 30.82 kg/m  No intake/output data recorded. Total I/O In: -  Out: 1300 [Urine:1300]  FHT: Category 2, minimal variability, no accels no decels UC:   Regular q2 minutes SVE:   5/80/-1 Small amount of dark blood clots/mucus present Pt repositioned R lateral and Pitocin stopped 0315, O2 by nonrebreather initiated and variability obtained. Accel noted.  Assessment:  Active labor ROM x 7h Category 2 strip improved with interventions  Plan: Monitor closely Anticipate SVD  Altha Harm CNM 12/30/2017, 3:36 AM

## 2017-12-30 NOTE — Lactation Note (Signed)
This note was copied from a baby's chart. Lactation Consultation Note Baby 38 hrs old. Mom needing assistance for latching and feeding. Mom states baby not wanting to BF. Stated baby has spit up once. Baby has very stuffy nose. Has difficulty sucking. Wouldn't suck on gloved finger. Tongue thrusting. Mom stated that what the baby was doing when she was trying to latch. D/t nasal congestion LC placed baby in football position arched nose w/chin more into breast. Cheeks touching breast, nose not buried into breast. Notified RN of nasal congestion.  Mom has very short shaft. Needs stimulation to evert enough for baby to feel in her mouth. LC t-cup breast and nipple into baby's mouth. Took several times, baby latched and feeding well.  Gave shells to wear in am to assist in everting to have deeper latch. Encouraged to pre-pump prior to latching. Taught mom hand expression. Mom demonstrated w/glisten of colostrum noted. Baby cont. To feed well when LC left. Encouraged mom feed STS, place blanket on outside of baby.  Patient Name: Julie Fleming [id number] Date: 12/30/2017 Reason for consult: Mother's request;Difficult latch   Maternal Data    Feeding Feeding Type: Breast Fed  LATCH Score Latch: Grasps breast easily, tongue down, lips flanged, rhythmical sucking.  Audible Swallowing: A few with stimulation  Type of Nipple: Everted at rest and after stimulation(very short shaft)  Comfort (Breast/Nipple): Soft / non-tender  Hold (Positioning): Assistance needed to correctly position infant at breast and maintain latch.  LATCH Score: 8  Interventions Interventions: Breast feeding basics reviewed;Adjust position;Assisted with latch;Support pillows;Skin to skin;Position options;Breast massage;Hand express;Shells;Pre-pump if needed;Breast compression;Hand pump  Lactation Tools Discussed/Used Tools: Shells;Pump(shells to wear in am.) Shell Type: Inverted Breast pump type:  Manual Pump Review: Setup, frequency, and cleaning;Milk Storage Initiated by:: Allayne Stack RN IBCLC Date initiated:: 12/30/17   Consult Status Consult Status: Follow-up Date: 12/31/17 Follow-up type: In-patient    Julie Fleming 12/30/2017, 11:09 PM

## 2017-12-30 NOTE — Brief Op Note (Signed)
12/29/2017 - 12/30/2017  10:58 AM  PATIENT:  Julie Fleming  32 y.o. female  PRE-OPERATIVE DIAGNOSIS:  IUP @ 39 0/7 weeks primary cesarean section; suspected CPD, GDM A2 (Insulin)  POST-OPERATIVE DIAGNOSIS:  Same  PROCEDURE:  Procedure(s): CESAREAN SECTION (N/A), Primary LTCS, 2 layer closure  SURGEON:  Surgeon(s) and Role:    Thurnell Lose, MD - Primary  PHYSICIAN ASSISTANT: None  ASSISTANTS: Technician  ANESTHESIA:   epidural  EBL:  599 ml   BLOOD ADMINISTERED:none  DRAINS: Urinary Catheter (Foley)   LOCAL MEDICATIONS USED:  NONE  SPECIMEN:  Source of Specimen:  Placenta  DISPOSITION OF SPECIMEN:  PATHOLOGY  COUNTS:  YES  TOURNIQUET:  * No tourniquets in log *  DICTATION: .Other Dictation: Dictation Number  [id number]  PLAN OF CARE: Transfer to postpartum  PATIENT DISPOSITION:  PACU - hemodynamically stable.   Delay start of Pharmacological VTE agent (>24hrs) due to surgical blood loss or risk of bleeding: no

## 2017-12-30 NOTE — Progress Notes (Addendum)
Julie Fleming is a 32 y.o. G2P0010 at [redacted]w[redacted]d   Updated by [facility] team.  Tracing improved with dextrose infusion and now pushing.      Subjective: Pt reports rectal pressure.  Comfortable with epidural #2 per husband.  Objective: BP 112/61   Pulse 92   Temp 100.2 F (37.9 C) (Oral)   Resp 18   Ht 5\' 2"  (1.575 m)   Wt 76.4 kg   LMP 04/01/2017   SpO2 99%   BMI 30.82 kg/m  I/O last 3 completed shifts: In: -  Out: 3100 [Urine:3100] Total I/O In: -  Out: 900 [Urine:900]  FHT:  FHR: 160s bpm, variability: moderate,  accelerations:  Present,  decelerations:  Present previous as noted by CNM.  None in the last 20 minutes.  +fetal scalp stiumulation UC:   regular, every 2 minutes SVE:   10/0 station but caput to +1-+2   Labs: Lab Results  Component Value Date   WBC 9.6 12/29/2017   HGB 11.8 (L) 12/29/2017   HCT 36.3 12/29/2017   MCV 83.6 12/29/2017   PLT 267 12/29/2017   CBG (last 3)  Recent Labs    12/30/17 0340 12/30/17 0420 12/30/17 0654  GLUCAP 69* 74 94     Assessment / Plan: IUP @ 39 0/7 weeks Gest DM-low normal blood sugar overnight.  No insulin administered.  Labor: Pitocin off.  Pushing with good effort.  Concerns CPD based on station and caput.  Addendum:  Asynclitic presentation by appearance of abdomen.  Placed on peanut and allowed to labor down. Preeclampsia:  BP normal. Fetal Wellbeing:  Category I Pain Control:  Epidural I/D:  S/p AROM x 11 hours.  Elevation in temperature, fetal tachycardia.  Unasyn ordered. Anticipated MOD:  Guarded.  If not fetal descent and/or if tracing worsens, proceed with cesarean section.  Thurnell Lose 12/30/2017, 7:46 AM

## 2017-12-30 NOTE — Anesthesia Postprocedure Evaluation (Signed)
Anesthesia Post Note  Patient: Julie Fleming  Procedure(s) Performed: CESAREAN SECTION (N/A Abdomen)     Patient location during evaluation: Mother Baby Anesthesia Type: Epidural Level of consciousness: awake and alert Pain management: pain level controlled Vital Signs Assessment: post-procedure vital signs reviewed and stable Respiratory status: spontaneous breathing, nonlabored ventilation and respiratory function stable Cardiovascular status: stable Postop Assessment: no headache, no backache, epidural receding, able to ambulate, adequate PO intake, no apparent nausea or vomiting and patient able to bend at knees Anesthetic complications: no    Last Vitals:  Vitals:   12/30/17 1318 12/30/17 1420  BP: 119/74 107/62  Pulse: 72 95  Resp: 18 18  Temp: 37 C 36.7 C  SpO2: 95% 95%    Last Pain:  Vitals:   12/30/17 1420  TempSrc: Oral  PainSc:    Pain Goal:                 [employer]

## 2017-12-30 NOTE — Progress Notes (Addendum)
Julie Fleming is a 32 y.o. G2P0010 at [redacted]w[redacted]d admitted for induction of labor for BPP of 4/10, GDMA2 on insulin I was called to evaluate the patient at about 6 am for abnormal tracing.   Subjective: Patient feels rectal pressure.    Objective: BP (!) 137/57   Pulse (!) 102   Temp 100.2 F (37.9 C) (Oral)   Resp 18   Ht 5\' 2"  (1.575 m)   Wt 76.4 kg   LMP 04/01/2017   SpO2 99%   BMI 30.82 kg/m  I/O last 3 completed shifts: In: -  Out: 3100 [Urine:3100] No intake/output data recorded.  FHT:  FHR: 150 bpm, variability: moderate,  accelerations:  Present,  decelerations:  Absent UC:   regular, every 3- 4 minutes SVE:   Dilation: 10 Effacement (%): 100% Station: 0, Plus 1 Exam by:: Dr. Waymon Amato.  FSG 94.  Labs: Lab Results  Component Value Date   WBC 9.6 12/29/2017   HGB 11.8 (L) 12/29/2017   HCT 36.3 12/29/2017   MCV 83.6 12/29/2017   PLT 267 12/29/2017    Assessment / Plan: Induction of labor for GDMA2, BPP 4/10  Labor: Patient fully dilated.  EFM showed abnormal tracing before but now improved with pushing in side position and dextrose infusion.  Continue with pushing.  We discussed that EFM will be watched closely, if abnornmal tracing continues then will need cesarean delivery.  We discussed risks, benefits and alternatives of cesarean section including risks of bleeding, infection and damage to organs.  Patient expressed understanding.  Preeclampsia:  None Fetal Wellbeing:  Category I Pain Control:  Epidural I/D:  GBS negative.  Anticipated MOD:  NSVD versus C/S.   Alinda Dooms, MD.  12/30/2017, 7:21 AM

## 2017-12-30 NOTE — Anesthesia Postprocedure Evaluation (Signed)
Anesthesia Post Note  Patient: Julie Fleming  Procedure(s) Performed: CESAREAN SECTION (N/A Abdomen)     Patient location during evaluation: Mother Baby Anesthesia Type: Epidural Level of consciousness: awake and alert Pain management: pain level controlled Vital Signs Assessment: post-procedure vital signs reviewed and stable Respiratory status: spontaneous breathing, nonlabored ventilation and respiratory function stable Cardiovascular status: stable Postop Assessment: no headache, no backache and epidural receding Anesthetic complications: no    Last Vitals:  Vitals:   12/30/17 1220 12/30/17 1318  BP: 114/88 119/74  Pulse: 74 72  Resp: 18 18  Temp: 36.7 C 37 C  SpO2: 98% 95%    Last Pain:  Vitals:   12/30/17 1318  TempSrc: Oral  PainSc:    Pain Goal:                 Ranay Ketter

## 2017-12-30 NOTE — Progress Notes (Signed)
Subjective: Pt and husband sleeping in labor room.   Objective: BP 110/71   Pulse 91   Temp 97.9 F (36.6 C) (Oral)   Resp 18   Ht 5\' 2"  (1.575 m)   Wt 76.4 kg   LMP 04/01/2017   SpO2 98%   BMI 30.82 kg/m  Total I/O In: -  Out: 1300 [Urine:1300]  FHT: 135, moderate, no accels, no decels UC:   4-5 in 10 minutes SVE:   Dilation: 4 Effacement (%): 90 Station: -2 Exam by:: [employer] CNM  Pitocin started, low dose 1 x 1 Peanut ball placed, lateral position MVUs 160  Assessment:  Early labor, ruptured membranes x 5h, adequate pain control. Fetal tracing category 1   Plan: Continue to monitor tracing. Anticipate SVD.  Altha Harm CNM,  12/30/2017, 1:03 AM

## 2017-12-30 NOTE — Anesthesia Procedure Notes (Signed)
Epidural Patient location during procedure: OB Start time: 12/30/2017 4:32 AM End time: 12/30/2017 4:41 AM  Staffing Anesthesiologist: Barnet Glasgow, MD Performed: anesthesiologist   Preanesthetic Checklist Completed: patient identified, site marked, surgical consent, pre-op evaluation, timeout performed, IV checked, risks and benefits discussed and monitors and equipment checked  Epidural Patient position: sitting Prep: site prepped and draped and DuraPrep Patient monitoring: continuous pulse ox and blood pressure Approach: midline Location: L3-L4 Injection technique: LOR air  Needle:  Needle type: Tuohy  Needle gauge: 17 G Needle length: 9 cm and 9 Needle insertion depth: 6 cm Catheter type: closed end flexible Catheter size: 19 Gauge Catheter at skin depth: 11 cm Test dose: negative  Assessment Events: blood not aspirated, injection not painful, no injection resistance, negative IV test and no paresthesia  Additional Notes 1 attempt . Pt tolerated procedure well

## 2017-12-30 NOTE — Transfer of Care (Signed)
Immediate Anesthesia Transfer of Care Note  Patient: Julie Fleming  Procedure(s) Performed: CESAREAN SECTION (N/A Abdomen)  Patient Location: PACU  Anesthesia Type:Epidural  Level of Consciousness: awake, alert  and oriented  Airway & Oxygen Therapy: Patient Spontanous Breathing  Post-op Assessment: Report given to RN and Post -op Vital signs reviewed and stable  Post vital signs: Reviewed and stable  Last Vitals:  Vitals Value Taken Time  BP 123/68 12/30/2017 11:15 AM  Temp    Pulse 107 12/30/2017 11:16 AM  Resp    SpO2 86 % 12/30/2017 11:16 AM  Vitals shown include unvalidated device data.  Last Pain:  Vitals:   12/30/17 0801  TempSrc: Axillary  PainSc:          Complications: No apparent anesthesia complications

## 2017-12-30 NOTE — Progress Notes (Signed)
Subjective: Pt feeling rectal pressure.  Objective: BP (!) 115/54   Pulse 81   Temp 97.9 F (36.6 C) (Oral)   Resp 18   Ht 5\' 2"  (1.575 m)   Wt 76.4 kg   LMP 04/01/2017   SpO2 100%   BMI 30.82 kg/m  No intake/output data recorded. Total I/O In: -  Out: 3100 [Urine:3100]  FHT: minimal variability UC:   regular, every 2 minutes SVE:   Dilation: 10 Effacement (%): 80 Station: 0, Plus 1 Exam by:: Altha Harm CNM  Pitocin remains off Bolus D5 .45NS per Dr. Alesia Richards Practice push elicited decel Pushing resumed when MD at [facility] (Dr. Alesia Richards at [phone])   Assessment:  Dilation complete Bolus complete Ruptured membranes 11.5h  Plan: Continue pushing as tolerated. Attempt vaginal delivery   Altha Harm CNM 12/30/2017, 6:30 AM

## 2017-12-30 NOTE — Addendum Note (Signed)
Addendum  created 12/30/17 1513 by Hewitt Blade, CRNA   Sign clinical note

## 2017-12-31 LAB — CBC
HEMATOCRIT: 29.5 % — AB (ref 36.0–46.0)
Hemoglobin: 9.6 g/dL — ABNORMAL LOW (ref 12.0–15.0)
MCH: 26.7 pg (ref 26.0–34.0)
MCHC: 32.5 g/dL (ref 30.0–36.0)
MCV: 82.2 fL (ref 80.0–100.0)
NRBC: 0 % (ref 0.0–0.2)
Platelets: 204 10*3/uL (ref 150–400)
RBC: 3.59 MIL/uL — ABNORMAL LOW (ref 3.87–5.11)
RDW: 16.1 % — AB (ref 11.5–15.5)
WBC: 14.3 10*3/uL — ABNORMAL HIGH (ref 4.0–10.5)

## 2017-12-31 NOTE — Progress Notes (Signed)
Late entry-Pt seen at ~8:30 AM  Subjective: Postop Day 1: Cesarean Delivery No complaints.  Pain controlled.  Lochia normal.  Breast feeding yes.  Objective: Temp:  [97.5 F (36.4 C)-98.6 F (37 C)] 97.8 F (36.6 C) (11/20 0835) Pulse Rate:  [68-99] 68 (11/20 0835) Resp:  [18-20] 20 (11/20 0835) BP: (92-119)/(39-76) 112/71 (11/20 0835) SpO2:  [95 %-99 %] 98 % (11/20 0835)  I/O last 3 completed shifts: In: 3600 [P.O.:800; I.V.:2800] Out: 8023 [Urine:7050; Blood:973] Total I/O In: -  Out: 300 [Urine:300]     Physical Exam: Gen: NAD Lochia: Not visualized Uterine Fundus: firm, appropriately tender Incision: pressure dressing on DVT Evaluation: + Edema present, no calf tenderness bilaterally   Recent Labs    12/29/17 1118 12/31/17 0529  HGB 11.8* 9.6*  HCT 36.3 29.5*    Assessment/Plan: Status post C-section-doing well postoperatively. Presumed chorioamnionitis. S/p Unasyn x 24 hours.  Afebrile since delivery. Hypotension but vitals are stable and UOP adequate. Continue to observe urine output. Ambulate in halls TID. Lactation support.    Thurnell Lose 12/31/2017, 12:39 PM

## 2017-12-31 NOTE — Op Note (Signed)
NAMESHAWNIE, Julie Fleming MEDICAL RECORD [id number] ACCOUNT [id number] DATE OF BIRTH:[birth year] FACILITY: [facility] LOCATION: [place], [place]  OPERATIVE REPORT  DATE OF PROCEDURE:  12/30/2017  PREOPERATIVE DIAGNOSES:   1.  Intrauterine pregnancy at 10 and 0/7th weeks. 2.  Suspected cephalopelvic disproportion, gestational diabetes A2, on insulin and oligohydramnios.  POSTOPERATIVE DIAGNOSIS: 1.  Intrauterine pregnancy at 45 and 0/7th weeks. 2.  Suspected cephalopelvic disproportion, gestational diabetes A2, on insulin and oligohydramnios.  PROCEDURE:  Primary low transverse cesarean section with 2 layer closure.  SURGEON:  Thurnell Lose, MD  ASSISTANT:  Technician.  ANESTHESIA:  Epidural.  ESTIMATED BLOOD LOSS: 599  BLOOD ADMINISTERED:  None.  DRAINS:  Foley catheter.  LOCAL:  None.  SPECIMENS:  Placenta.  DISPOSITION OF SPECIMEN:  To pathology.  PATIENT DISPOSITION:  To PACU hemodynamically stable.  COMPLICATIONS:  None.  FINDINGS:  Viable female infant in the vertex OP presentation, (occiput posterior presentation), cord around each shoulder and body cord x1.  Cloudy or purulent appearing amniotic fluid, but no odor.  Normal uterus, fallopian tubes and ovaries  bilaterally.  PROCEDURE IN DETAIL:  Fetal tracing  The patient was completely dilated had been pushing vacuum extraction was attempted, but minimal fetal descent before the baby would come back to a +1 station.  The patient was then consented for C section due to  suspected CPD.  She was taken to the OR.  Her epidural anesthesia was optimized.  She was prepped and draped in normal sterile fashion.  SCDs were on her legs and operating room.  A timeout was performed.  The patient received Ancef while laboring, and  gentamicin and clindamycin prior to the surgery.  Abdomen was marked for a Pfannenstiel skin incision, which was made with the scalpel, carried down to the underlying layer  of the fascia with the Bovie.  Fascia was then incised and extended laterally with the curved Mayo scissors.  Rectus muscles were  dissected sharply off of the fascia.  Peritoneum was then identified with hemostat and entered sharply and stretched.  Alexis retractor was then placed.  Lower uterine segment was identified.  Bladder flap was developed with the Metzenbaum scissors.  A hand from below was called.  The transverse incision was made on the lower uterine segment, extended with the bandage scissors.  The head was easily  brought through the incision.  No vacuum was needed.  Head was delivered.  Shoulder cord that was removed and then the other cord and then body cord was reduced.  The patient was suctioned on the field.  There was bleeding from the uterus.  The cord was  clamped x2 and cut and baby handed off to the awaiting NICU staff.  The edges were then grasped with the ring forceps.  Uterus was nice and firm.  Hysterotomy incision was closed with a 0 Vicryl in a continuous locked fashion.  Second of the same suture was used for imbrication.  A few stitches were performed for hemostasis.  Copious irrigation of the abdomen was performed.  All clots and debris was removed and there was a moistened laparotomy sponge used to displace the colon that was eventually removed.  Interceed was then applied over the incision.  Peritoneum was reapproximated with 2-0 Vicryl in continuous fashion.  Subcutaneous space was closed with -0 plain gut in a continuous fashion.  Skin was reapproximated with 4-0 Monocryl in a subcuticular fashion.  Steri-Strips, benzoin and pressure  dressing were applied.    The patient  tolerated the procedure well.   She was taken to the operating room with IV running and in a stable condition.  AN/NUANCE  D:12/31/2017 T:12/31/2017 JOB:[id number]

## 2018-01-01 ENCOUNTER — Other Ambulatory Visit: Payer: Self-pay

## 2018-01-01 ENCOUNTER — Inpatient Hospital Stay (HOSPITAL_COMMUNITY): Admission: RE | Admit: 2018-01-01 | Payer: 59 | Source: Ambulatory Visit

## 2018-01-01 NOTE — Lactation Note (Signed)
This note was copied from a baby's chart. Lactation Consultation Note:  Mother request assistance with latching infant . Mother reports that her nipples are very sore.  Observed mother has bilateral positional strips.   Mother reports that infant cluster fed during the night.   Discussed proper latch with mother.  Assist mother to position infant in cross cradle hold.  FOB at bedside for teaching on how to assist mother.  Used a tea-cup hold to get infant latched with good deep latch. Infant sustained latch for 25 mins and another 20 mins on the alternate breast.  Observed a few swallows and good burst of suckling.  Mother denied having any discomfort with feeding.    Infant has a high palate with a thick posterior tongue tie.  Discussed possible use of a nipple shield if needed.  Report given to pt staff nurse.   Advised mother to post pump with hand pump for 15 mins on each side.  Mother to follow up with paging for assistance as needed. Patient Name: Julie Fleming Today's Date: 01/01/2018 Reason for consult: (P) Follow-up assessment Type of Endocrine Disorder?: (P) Diabetes   Maternal Data    Feeding Feeding Type: (P) Breast Fed  LATCH Score Latch: (P) Grasps breast easily, tongue down, lips flanged, rhythmical sucking.  Audible Swallowing: (P) A few with stimulation  Type of Nipple: (P) Everted at rest and after stimulation  Comfort (Breast/Nipple): (P) Filling, red/small blisters or bruises, mild/mod discomfort  Hold (Positioning): (P) Assistance needed to correctly position infant at breast and maintain latch.  LATCH Score: (P) 7  Interventions Interventions: (P) Breast feeding basics reviewed;Assisted with latch;Skin to skin;Hand express;Breast compression;Adjust position;Support pillows;Position options;Expressed milk;Shells;Hand pump  Lactation Tools Discussed/Used     Consult Status Consult Status: (P) Follow-up Date: (P)  01/02/18 Follow-up type: (P) In-patient    Jess [facility] 01/01/2018, 1:45 PM

## 2018-01-01 NOTE — Lactation Note (Addendum)
This note was copied from a baby's chart. Lactation Consultation Note  Patient Name: Julie Fleming [id number] Date: 01/01/2018 Reason for consult: Follow-up assessment;Primapara;Nipple pain/trauma;Term Type of Endocrine Disorder?: Diabetes  Mom requested assistance with breastfeeding and whether she should give baby formula.  Baby is acting hungry after breastfeeding on both breasts for 40 mins.    Baby sleeping in GFOB's arms currently.  It has been almost 2 hrs since last feeding.  Baby started stirring, so offered to watch baby latch.  Mom had just double pumped and expressed 1 ml.  Parents concerned that baby isn't able to get enough when breastfeeding.  Mom very good with positioning and latching in cross cradle hold.  Baby opened wide and latched deeply.  Top lip tucked in, so showed Mom how to untuck it gently.  FOB aware of how to tug on chin to uncurl lower lip.    Taught Mom to do alternate breast compression to increase milk transfer.  Swallows identified for Mom.  Baby rhythmic and consistently sucking and swallowing.   Baby spoon fed 48ml between breasts.   Encouraged Mom to keep baby STS as much as possible.  To pump after breastfeeding, especially if Mom chooses to offer formula.    If baby doesn't act satisfied, and baby obtains formula supplement, recommended spoon or curved tip syringe 15-30 ml, and Mom knows to double pump on initiation setting.   Consult Status Consult Status: Follow-up Date: 01/02/18 Follow-up type: In-patient    [name] 01/01/2018, 5:29 PM

## 2018-01-01 NOTE — Progress Notes (Signed)
   Subjective: Postop Day 2: Cesarean Delivery Pt feels bleeding has increased.  Pt reports baby has been cluster feeding all night.  Discomfort with pressure dressing.  Objective: Temp:  [97.6 F (36.4 C)-98.5 F (36.9 C)] 98 F (36.7 C) (11/21 0643) Pulse Rate:  [68-88] 70 (11/21 0643) Resp:  [17-20] 18 (11/21 0643) BP: (112-134)/(60-88) 134/88 (11/21 0643) SpO2:  [98 %-100 %] 100 % (11/20 2250)  I/O last 3 completed shifts: In: 1020 [P.O.:1020] Out: 2550 [Urine:2550] No intake/output data recorded.     Physical Exam: Gen: NAD Lochia: Not visualized Uterine Fundus: firm, appropriately tender Incision: pressure dressing on DVT Evaluation: + Edema present, no calf tenderness bilaterally   Recent Labs    12/29/17 1118 12/31/17 0529  HGB 11.8* 9.6*  HCT 36.3 29.5*    Assessment/Plan: Status post C-section-doing well postoperatively. Presumed chorioamnionitis. S/p Unasyn x 24 hours.  Afebrile since delivery. Ambulate in halls TID. Lactation support. Anticipate discharge in am.   Thurnell Lose 01/01/2018, 8:26 AM

## 2018-01-01 NOTE — Lactation Note (Signed)
This note was copied from a baby's chart. Lactation Consultation Note  Patient Name: Julie Fleming [id number] Date: 01/01/2018 Reason for consult: Follow-up assessment;Mother's request;Maternal endocrine disorder Type of Endocrine Disorder?: Diabetes P1. 72 hour female infant, weight loss -6% BF concerns: difficulties with latching infant to breast, sore nipples w/ abrasions and flat breast.  Per mom she has not been compliant with wear breast shells to help extend nipple shaft out more and only used DEBP once today. LC entered room dad had finished giving infant 15 ml of Similac with iron using bottle nipple,  infant was still cuing to feed.  Per mom, she wanted to try football hold she never tried that position. Mom has flat nipple, LC assisted mom in doing a nipple roll and hand express a small amount of colostrum prior to infant latching to breast. Mom latched infant on left breast using foot ball hold infant mouth was wide with lower jaw extended, LC saw audible swallows per mom no pain with latch. Infant was BF for 15 minutes and still BF as LC left room. LC instructed mom how to use comfort gels due abrasions on both breast.  Mom's BF plans: 1. Mom will breast feed according hunger cues will not exceed 3 hours without BF infant. 2. Mom will pre-pump or do nipple roll with a little hand expression of colostrum prior to latching infant to breast. 3. Mom plans to be compliant with wear her  breast shells to help elongate nipple shaft out more as previously advised by other LC's. 4. Mom plans to use DEBP 6 to 8 times after BF infant. 5.Mom will BF first , give infant back EBM that was hand expressed or pump,  and then supplement with formula (parents current choice) according to age/ hours. 6. Mom call for BF assistance prn.    Maternal Data    Feeding Feeding Type: Breast Fed  LATCH Score Latch: Grasps breast easily, tongue down, lips flanged, rhythmical sucking.  Audible  Swallowing: Spontaneous and intermittent  Type of Nipple: Flat  Comfort (Breast/Nipple): Filling, red/small blisters or bruises, mild/mod discomfort  Hold (Positioning): Assistance needed to correctly position infant at breast and maintain latch.  LATCH Score: 7  Interventions Interventions: Hand express;Support pillows;Adjust position;Position options;Breast compression;Shells;Comfort gels;DEBP  Lactation Tools Discussed/Used Tools: Comfort gels Breast pump type: Double-Electric Breast Pump   Consult Status Consult Status: Follow-up Date: 01/02/18 Follow-up type: In-patient    Julie Fleming 01/01/2018, 9:54 PM

## 2018-01-01 NOTE — Lactation Note (Addendum)
This note was copied from a baby's chart. Lactation Consultation Note Baby 6 hrs old. Cluster feeding. Mom crying, having severe cramping from BF. Mom states nipples are sore. Has coconut oil. Mom states she has been BF for 4 hrs and can't do it any more right now, needs a break. Baby still acting hungry. Supplementation discussed. LEAD. Information on supplementing and amount according to hours of age. LC gave Similac via curve tip syring and gloved finger.   Mom not able to pump d/t cluster feeding.  Mom took pain medication for the pain. FOB attentive to mom. Baby sleeping in crib when [place] left.  Patient Name: Girl Lanai Conlee [id number] Date: 01/01/2018 Reason for consult: Follow-up assessment   Maternal Data    Feeding Feeding Type: Formula  LATCH Score       Type of Nipple: Everted at rest and after stimulation  Comfort (Breast/Nipple): Filling, red/small blisters or bruises, mild/mod discomfort        Interventions Interventions: Coconut oil  Lactation Tools Discussed/Used     Consult Status Consult Status: Follow-up Date: 01/01/18 Follow-up type: In-patient    Florence Antonelli, Elta Gu[place] 01/01/2018, 2:13 AM

## 2018-01-02 ENCOUNTER — Encounter (HOSPITAL_COMMUNITY): Payer: Self-pay | Admitting: *Deleted

## 2018-01-02 LAB — GLUCOSE, CAPILLARY: Glucose-Capillary: 89 mg/dL (ref 70–99)

## 2018-01-02 MED ORDER — IBUPROFEN 600 MG PO TABS: 600.0000 mg | ORAL_TABLET | Freq: Four times a day (QID) | ORAL | 1 refills | Status: DC

## 2018-01-02 MED ORDER — OXYCODONE HCL 5 MG PO TABS: 5.0000 mg | ORAL_TABLET | ORAL | 0 refills | Status: DC | PRN

## 2018-01-02 NOTE — Discharge Instructions (Signed)

## 2018-01-02 NOTE — Lactation Note (Signed)
This note was copied from a baby's chart. Lactation Consultation Note:  Arrived in mothers room and observed her breastfeeding infant in cross cradle hold. Infant latched on the rt breast , observed frequent suckling and swallows.   Mother reports that left breast is tender and swollen.  Assist mother with massage and pre-pumping for several mins. Infant latched on the left breast , observe burst of suckling and audible swallows.  Advised mother to massage, ice and breastfeed frequently. Suggested that she limit formula. Advised mother to post pump after feedings and use her own milk to supplement.  Discussed cluster feeding and advised mother to breastfeed infant 8-12 times or more. Mothers nipples are pink and slightly tender. She has comfort gels .   Advised mother to follow up with [facility] services at [facility] as needed.  Mother receptive to feeding plan.   Patient Name: Julie Fleming [id number] Date: 01/02/2018 Reason for consult: Follow-up assessment Type of Endocrine Disorder?: Diabetes   Maternal Data    Feeding Feeding Type: Breast Fed  LATCH Score Latch: Grasps breast easily, tongue down, lips flanged, rhythmical sucking.  Audible Swallowing: Spontaneous and intermittent  Type of Nipple: Everted at rest and after stimulation  Comfort (Breast/Nipple): Filling, red/small blisters or bruises, mild/mod discomfort  Hold (Positioning): Assistance needed to correctly position infant at breast and maintain latch.  LATCH Score: 8  Interventions Interventions: Assisted with latch;Skin to skin;Breast massage;Hand express;Pre-pump if needed;Adjust position;Support pillows;Position options;Comfort gels  Lactation Tools Discussed/Used     Consult Status Consult Status: Complete    Darla Lesches 01/02/2018, 10:16 AM

## 2018-01-02 NOTE — Discharge Summary (Signed)
    OB Discharge Summary     Patient Name: Julie Fleming DOB: February 19, 1985 MRN: [id number]  Date of admission: 12/29/2017 Delivering MD: Thurnell Lose   Date of discharge: 01/02/2018  Admitting diagnosis: 39 wks-induction Intrauterine pregnancy: [redacted]w[redacted]d     Secondary diagnosis:  Active Problems:   Gestational diabetes mellitus (GDM) in third trimester  Additional problems: None     Discharge diagnosis: Term Pregnancy Delivered                                                                                                Post partum procedures:None  Augmentation: AROM, Pitocin and Cytotec  Complications: Presumed chorioamnionitis  Hospital course:  Onset of Labor With Unplanned C/S  32 y.o. yo G2P0010 at [redacted]w[redacted]d was admitted on 12/29/2017 due to BPP 2/8 , AFI 2 cm at office. Patient had a labor course significant for fetal decelerations.  Got to complete, failed VAVD due to suspected CPD. Membrane Rupture Time/Date: 8:27 PM ,12/29/2017   The patient went for cesarean section due to CPD, and delivered a Viable infant,12/30/2017  Details of operation can be found in separate operative note. Patient had an uncomplicated postpartum course.  She is ambulating,tolerating a regular diet, passing flatus, and urinating well.  Patient is discharged home in stable condition 01/02/18.  Fever of 102 prior to c-section.  Unasyn started.  Gent/Clinda at incision.  Continued Unasyn x 24 hours.  Physical exam  Vitals:   01/01/18 0643 01/01/18 1551 01/01/18 2156 01/02/18 0457  BP: 134/88 129/89 117/75 129/84  Pulse: 70 66 67 68  Resp: 18 19 18 18   Temp: 98 F (36.7 C) (!) 97.4 F (36.3 C) 98.6 F (37 C) 97.8 F (36.6 C)  TempSrc: Oral Oral Axillary Axillary  SpO2:  100% 100% 100%  Weight:      Height:       General: alert, cooperative and no distress Lochia: appropriate Uterine Fundus: firm Incision: Healing well with no significant drainage, Dressing is clean, dry, and intact DVT  Evaluation: No evidence of DVT seen on physical exam. Calf/Ankle edema is present Labs: Lab Results  Component Value Date   WBC 14.3 (H) 12/31/2017   HGB 9.6 (L) 12/31/2017   HCT 29.5 (L) 12/31/2017   MCV 82.2 12/31/2017   PLT 204 12/31/2017   No flowsheet data found.  Discharge instruction: per After Visit Summary and "Baby and Me Booklet".  After visit meds:  Ibuprofen 600 mg Roxicet 5 mg #15 Continue Iron.  Diet: routine diet  Activity: Advance as tolerated. Pelvic rest for 6 weeks.   Outpatient follow up:2 weeks Follow up Appt:No future appointments. Follow up Visit:No follow-ups on file.  Postpartum contraception: Discussed  Newborn Data: Live born female  Birth Weight: 7 lb 5.8 oz (3340 g) APGAR: 32, 9  Newborn Delivery   Birth date/time:  12/30/2017 10:08:00 Delivery type:  C-Section, Low Transverse Trial of labor:  Yes C-section categorization:  Primary     Baby Feeding: Breast Disposition:home with mother   01/02/2018 Thurnell Lose, MD

## 2018-01-07 ENCOUNTER — Other Ambulatory Visit: Payer: Self-pay

## 2018-01-07 ENCOUNTER — Encounter (HOSPITAL_COMMUNITY): Payer: Self-pay

## 2018-01-07 ENCOUNTER — Observation Stay (HOSPITAL_COMMUNITY)
Admission: AD | Admit: 2018-01-07 | Discharge: 2018-01-09 | Disposition: A | Discharge status: Home / Self Care | Payer: 59 | Source: Ambulatory Visit | Attending: Obstetrics and Gynecology | Admitting: Obstetrics and Gynecology

## 2018-01-07 DIAGNOSIS — E119 Type 2 diabetes mellitus without complications: Secondary | ICD-10-CM | POA: Diagnosis not present

## 2018-01-07 DIAGNOSIS — M546 Pain in thoracic spine: Secondary | ICD-10-CM | POA: Diagnosis present

## 2018-01-07 DIAGNOSIS — Z79899 Other long term (current) drug therapy: Secondary | ICD-10-CM | POA: Insufficient documentation

## 2018-01-07 DIAGNOSIS — M25511 Pain in right shoulder: Secondary | ICD-10-CM

## 2018-01-07 DIAGNOSIS — O1495 Unspecified pre-eclampsia, complicating the puerperium: Secondary | ICD-10-CM | POA: Diagnosis present

## 2018-01-07 DIAGNOSIS — O152 Eclampsia in the puerperium: Principal | ICD-10-CM | POA: Insufficient documentation

## 2018-01-07 LAB — CBC
HCT: 34 % — ABNORMAL LOW (ref 36.0–46.0)
HEMOGLOBIN: 10.8 g/dL — AB (ref 12.0–15.0)
MCH: 26.5 pg (ref 26.0–34.0)
MCHC: 31.8 g/dL (ref 30.0–36.0)
MCV: 83.3 fL (ref 80.0–100.0)
NRBC: 0 % (ref 0.0–0.2)
PLATELETS: 377 10*3/uL (ref 150–400)
RBC: 4.08 MIL/uL (ref 3.87–5.11)
RDW: 15.7 % — ABNORMAL HIGH (ref 11.5–15.5)
WBC: 8.1 10*3/uL (ref 4.0–10.5)

## 2018-01-07 LAB — COMPREHENSIVE METABOLIC PANEL
ALT: 26 U/L (ref 0–44)
ANION GAP: 8 (ref 5–15)
AST: 22 U/L (ref 15–41)
Albumin: 2.8 g/dL — ABNORMAL LOW (ref 3.5–5.0)
Alkaline Phosphatase: 107 U/L (ref 38–126)
BUN: 6 mg/dL (ref 6–20)
CALCIUM: 8.2 mg/dL — AB (ref 8.9–10.3)
CO2: 21 mmol/L — ABNORMAL LOW (ref 22–32)
Chloride: 111 mmol/L (ref 98–111)
Creatinine, Ser: 0.52 mg/dL (ref 0.44–1.00)
Glucose, Bld: 102 mg/dL — ABNORMAL HIGH (ref 70–99)
POTASSIUM: 3.7 mmol/L (ref 3.5–5.1)
SODIUM: 140 mmol/L (ref 135–145)
Total Bilirubin: 0.3 mg/dL (ref 0.3–1.2)
Total Protein: 5.6 g/dL — ABNORMAL LOW (ref 6.5–8.1)

## 2018-01-07 LAB — PROTEIN / CREATININE RATIO, URINE
CREATININE, URINE: 76 mg/dL
PROTEIN CREATININE RATIO: 0.25 mg/mg{creat} — AB (ref 0.00–0.15)
TOTAL PROTEIN, URINE: 19 mg/dL

## 2018-01-07 LAB — URINALYSIS, ROUTINE W REFLEX MICROSCOPIC
Bilirubin Urine: NEGATIVE
Glucose, UA: NEGATIVE mg/dL
KETONES UR: NEGATIVE mg/dL
Nitrite: NEGATIVE
PH: 7 (ref 5.0–8.0)
PROTEIN: NEGATIVE mg/dL
Specific Gravity, Urine: 1.011 (ref 1.005–1.030)

## 2018-01-07 MED ORDER — MAGNESIUM SULFATE 40 G IN LACTATED RINGERS - SIMPLE
2.0000 g/h | INTRAVENOUS | Status: AC
  Administered 2018-01-08: 2 g/h via INTRAVENOUS
  Filled 2018-01-07 (×2): qty 500

## 2018-01-07 MED ORDER — LACTATED RINGERS IV SOLN
INTRAVENOUS | Status: DC
  Administered 2018-01-08: 12:00:00 via INTRAVENOUS

## 2018-01-07 MED ORDER — LABETALOL HCL 5 MG/ML IV SOLN
80.0000 mg | INTRAVENOUS | Status: DC | PRN
  Administered 2018-01-07: 80 mg via INTRAVENOUS
  Filled 2018-01-07: qty 16

## 2018-01-07 MED ORDER — LABETALOL HCL 5 MG/ML IV SOLN
20.0000 mg | INTRAVENOUS | Status: DC | PRN
  Administered 2018-01-08: 20 mg via INTRAVENOUS
  Filled 2018-01-07: qty 4

## 2018-01-07 MED ORDER — CYCLOBENZAPRINE HCL 10 MG PO TABS
10.0000 mg | ORAL_TABLET | Freq: Three times a day (TID) | ORAL | Status: DC | PRN
  Administered 2018-01-07 – 2018-01-08 (×3): 10 mg via ORAL
  Filled 2018-01-07 (×4): qty 1

## 2018-01-07 MED ORDER — HYDRALAZINE HCL 20 MG/ML IJ SOLN: 10.0000 mg | INTRAMUSCULAR | Status: DC | PRN

## 2018-01-07 MED ORDER — LABETALOL HCL 5 MG/ML IV SOLN
40.0000 mg | INTRAVENOUS | Status: DC | PRN
  Administered 2018-01-09: 40 mg via INTRAVENOUS
  Filled 2018-01-07: qty 8

## 2018-01-07 MED ORDER — LABETALOL HCL 5 MG/ML IV SOLN
20.0000 mg | INTRAVENOUS | Status: DC | PRN
  Administered 2018-01-07: 20 mg via INTRAVENOUS
  Filled 2018-01-07: qty 4

## 2018-01-07 MED ORDER — LABETALOL HCL 5 MG/ML IV SOLN: 80.0000 mg | INTRAVENOUS | Status: DC | PRN

## 2018-01-07 MED ORDER — LABETALOL HCL 5 MG/ML IV SOLN
40.0000 mg | INTRAVENOUS | Status: DC | PRN
  Administered 2018-01-07: 40 mg via INTRAVENOUS
  Filled 2018-01-07: qty 8

## 2018-01-07 MED ORDER — MAGNESIUM SULFATE BOLUS VIA INFUSION
4.0000 g | Freq: Once | INTRAVENOUS | Status: AC
  Administered 2018-01-07: 4 g via INTRAVENOUS
  Filled 2018-01-07: qty 500

## 2018-01-07 MED ORDER — IBUPROFEN 600 MG PO TABS
600.0000 mg | ORAL_TABLET | Freq: Four times a day (QID) | ORAL | Status: DC | PRN
  Administered 2018-01-07 – 2018-01-09 (×5): 600 mg via ORAL
  Filled 2018-01-07 (×5): qty 1

## 2018-01-07 NOTE — MAU Note (Signed)
Urine in lab 

## 2018-01-07 NOTE — H&P (Signed)
Julie Fleming is an 32 y.o. female POD #8 from primary C-section With h/o Gest DM on Insulin presents for pain behind her right shoulder blade x 5 days.  Pt reports she has been unable to sleep due to pain, which has not been relieved with Percocet.  Pt has been unable to lay flat to sleep. Pt states it was 10/10, currently 8/10.    Upon arrival to MAU, pt's BP was 170s/100s.  Pt reports she has had a headache which was more severe but now she reports it is mild.  Denies visual changes or upper abdominal pain.  Pertinent Gynecological History: OB History: G1, P1     Past Medical History:  Diagnosis Date  . Diabetes mellitus without complication ([place])   . Gestational diabetes     Past Surgical History:  Procedure Laterality Date  . CESAREAN SECTION N/A 12/30/2017   Procedure: CESAREAN SECTION;  Surgeon: Thurnell Lose, MD;  Location: [facility];  Service: Obstetrics;  Laterality: N/A;  . NO PAST SURGERIES      Family History  Problem Relation Age of Onset  . Diabetes Mother   . Hypertension Mother     Social History:  reports that she has never smoked. She has never used smokeless tobacco. She reports that she does not drink alcohol or use drugs.  Allergies: No Known Allergies  Medications Prior to Admission  Medication Sig Dispense Refill Last Dose  . acetaminophen (TYLENOL) 500 MG tablet Take 1,000 mg by mouth every 6 (six) hours as needed for mild pain.   Past Week at Unknown time  . Fe Fum-FePoly-Vit C-Vit B3 (INTEGRA) 62.5-62.5-40-3 MG CAPS Take 1 capsule by mouth daily.   12/29/2017 at Unknown time  . ibuprofen (ADVIL,MOTRIN) 600 MG tablet Take 1 tablet (600 mg total) by mouth every 6 (six) hours. 30 tablet 1 01/07/2018 at 1200  . oxyCODONE (OXY IR/ROXICODONE) 5 MG immediate release tablet Take 1 tablet (5 mg total) by mouth every 4 (four) hours as needed for moderate pain or severe pain (pain scale greater than 5). 15 tablet 0 01/07/2018 at 1200  . Prenatal  Vit-Fe Fumarate-FA (PRENATAL MULTIVITAMIN) TABS tablet Take 1 tablet by mouth at bedtime.   12/28/2017 at Unknown time    Review of Systems  Constitutional: Positive for malaise/fatigue. Negative for chills and fever.  Respiratory: Negative for cough and shortness of breath.   Gastrointestinal: Negative for abdominal pain.  Neurological: Positive for headaches.    Blood pressure (!) 138/98, pulse 86, temperature 98.2 F (36.8 C), temperature source Oral, resp. rate 16, height 5\' 2"  (1.575 m), weight 72.6 kg, SpO2 100 %, unknown if currently breastfeeding. Physical Exam  Constitutional: She is oriented to person, place, and time. She appears well-developed and well-nourished. No distress.  HENT:  Head: Normocephalic and atraumatic.  Eyes: EOM are normal.  Neck: Normal range of motion.  Respiratory: Effort normal. No respiratory distress.  GI: Soft.  Fundal tenderness.  Incision with steri strips, intact.  No discharge or erythema.  Musculoskeletal: Normal range of motion. She exhibits no tenderness.  SCDS in place. Point tenderness medial to left scapula upper 1/3.  Neurological: She is alert and oriented to person, place, and time.  Skin: Skin is warm and dry.  Yellow due to tumeric bath.  Psychiatric: She has a normal mood and affect.    Results for orders placed or performed during the hospital encounter of 01/07/18 (from the past 24 hour(s))  Protein / creatinine ratio, urine  Status: Abnormal   Collection Time: 01/07/18  4:33 PM  Result Value Ref Range   Creatinine, Urine 76.00 mg/dL   Total Protein, Urine 19 mg/dL   Protein Creatinine Ratio 0.25 (H) 0.00 - 0.15 mg/mg[Cre]  Urinalysis, Routine w reflex microscopic     Status: Abnormal   Collection Time: 01/07/18  4:37 PM  Result Value Ref Range   Color, Urine YELLOW YELLOW   APPearance HAZY (A) CLEAR   Specific Gravity, Urine 1.011 1.005 - 1.030   pH 7.0 5.0 - 8.0   Glucose, UA NEGATIVE NEGATIVE mg/dL   Hgb urine  dipstick LARGE (A) NEGATIVE   Bilirubin Urine NEGATIVE NEGATIVE   Ketones, ur NEGATIVE NEGATIVE mg/dL   Protein, ur NEGATIVE NEGATIVE mg/dL   Nitrite NEGATIVE NEGATIVE   Leukocytes, UA TRACE (A) NEGATIVE   RBC / HPF 6-10 0 - 5 RBC/hpf   WBC, UA 11-20 0 - 5 WBC/hpf   Bacteria, UA RARE (A) NONE SEEN   Squamous Epithelial / LPF 0-5 0 - 5   Mucus PRESENT   CBC     Status: Abnormal   Collection Time: 01/07/18  4:56 PM  Result Value Ref Range   WBC 8.1 4.0 - 10.5 K/uL   RBC 4.08 3.87 - 5.11 MIL/uL   Hemoglobin 10.8 (L) 12.0 - 15.0 g/dL   HCT 34.0 (L) 36.0 - 46.0 %   MCV 83.3 80.0 - 100.0 fL   MCH 26.5 26.0 - 34.0 pg   MCHC 31.8 30.0 - 36.0 g/dL   RDW 15.7 (H) 11.5 - 15.5 %   Platelets 377 150 - 400 K/uL   nRBC 0.0 0.0 - 0.2 %  Comprehensive metabolic panel     Status: Abnormal   Collection Time: 01/07/18  4:56 PM  Result Value Ref Range   Sodium 140 135 - 145 mmol/L   Potassium 3.7 3.5 - 5.1 mmol/L   Chloride 111 98 - 111 mmol/L   CO2 21 (L) 22 - 32 mmol/L   Glucose, Bld 102 (H) 70 - 99 mg/dL   BUN 6 6 - 20 mg/dL   Creatinine, Ser 0.52 0.44 - 1.00 mg/dL   Calcium 8.2 (L) 8.9 - 10.3 mg/dL   Total Protein 5.6 (L) 6.5 - 8.1 g/dL   Albumin 2.8 (L) 3.5 - 5.0 g/dL   AST 22 15 - 41 U/L   ALT 26 0 - 44 U/L   Alkaline Phosphatase 107 38 - 126 U/L   Total Bilirubin 0.3 0.3 - 1.2 mg/dL   GFR calc non Af Amer >60 >60 mL/min   GFR calc Af Amer >60 >60 mL/min   Anion gap 8 5 - 15    PCR 0.25  Assessment/Plan: Postpartum Preeclampsia with severe features based on blood pressure.  Magnesium x 24 hours.  Bedrest  Labetalol prn severe range BP. Shoulder blade pain.  Suspect musculoskeletal.  Flexeril 10 mg q 8 hrs prn.  If no improvement, proceed with CT scan to rule out aortic dissection or more concerning etiology. S/p c-section-Wound healing well. Difficulty latching.  Lactation consult. Dr. Mancel Bale covering 7 pm to 7 am.  Check out given. Thurnell Lose 01/07/2018, 7:14 PM

## 2018-01-07 NOTE — Lactation Note (Signed)
This note was copied from a baby's chart. Lactation Consultation Note  Patient Name: Julie Fleming VFIEP'P Date: 01/07/2018 Reason for consult: Follow-up assessment;Term;Maternal endocrine disorder;MD order Type of Endocrine Disorder?: Diabetes  RN called for [facility] consult due to MD order, mom has been readmitted in [facility] specialty care, but baby is with her, but she's not a patient, she's just rooming in with mom. Mom is supplementing baby with her EBM doing about 48-60 ml per feeding, but she's only pumping 3-4 times/day. Explained to mom the importance of consistent pumping when babies are having a difficult latch. Mom has been set up with a DEBP but the junctures in her pump were lose, LC adjusted them and let mom know that she'll notice a difference in the suction the next time she pumps.  Offered assistance with latch but mom politely declined and asked lactation to come back in the morning to help her latch baby on, she had visitors in her room. Discussed feeding cues, feeding with the syringe, and possibly a bottle with a nipple if syringe feedings become too challenging due to larger volumes now, dad wanted to know if she could try a bottle. Explained to parents the importance of BF to be fully established at 3-4 weeks before offering nipples and pacifiers; both parents voiced understanding.  Feeding plan:  1. Encouraged mom to keep taking baby to the breast STS 8-12 times/24 hours or sooner if feeding cues are present 2. Mom will start pumping every 3 hours and at least once at night, a total of 8 pumping sessions in 24 hours  Parents reported all questions and concerns were answered, they're both aware of [place] services and will call PRN.  Maternal Data    Feeding   Interventions Interventions: Breast feeding basics reviewed;DEBP  Lactation Tools Discussed/Used Tools: Pump Breast pump type: Double-Electric Breast Pump Pump Review: Setup, frequency, and cleaning;Milk  Storage Initiated by:: RN, adjusted by MPeck, IBCLC Date initiated:: 01/07/18   Consult Status Consult Status: Follow-up Date: 01/08/18 Follow-up type: In-patient    Julie Fleming 01/07/2018, 11:44 PM

## 2018-01-07 NOTE — MAU Provider Note (Signed)
History     CSN: [id number]  Arrival date and time: 01/07/18 1609   First Provider Initiated Contact with Patient 01/07/18 1646       Chief Complaint  Patient presents with  . Leg Swelling  . Back Pain  . Shoulder Pain   HPI Julie Fleming is a 32 y.o. G2P0011 postpartum from a caesarean section on 11/19 who presents with shoulder pain and swelling. She states she has had shoulder pain since she delivered. She also reports a sudden onset of swelling since she delivered. Upon arrival to MAU, she was found to have severe range blood pressures. Patient denies any history of elevated blood pressures. She reports a headache that she rates 5/10 and has tried ibuprofen with no relief. She also reports feeling dizzy. She denies any epigastric pain.   OB History    Gravida  2   Para      Term      Preterm      AB  1   Living  1     SAB  1   TAB      Ectopic      Multiple      Live Births              Past Medical History:  Diagnosis Date  . Diabetes mellitus without complication ([place])   . Gestational diabetes     Past Surgical History:  Procedure Laterality Date  . CESAREAN SECTION N/A 12/30/2017   Procedure: CESAREAN SECTION;  Surgeon: Thurnell Lose, MD;  Location: [place];  Service: Obstetrics;  Laterality: N/A;  . NO PAST SURGERIES      Family History  Problem Relation Age of Onset  . Diabetes Mother   . Hypertension Mother     Social History   Tobacco Use  . Smoking status: Never Smoker  . Smokeless tobacco: Never Used  Substance Use Topics  . Alcohol use: No  . Drug use: No    Allergies: No Known Allergies  Medications Prior to Admission  Medication Sig Dispense Refill Last Dose  . acetaminophen (TYLENOL) 500 MG tablet Take 1,000 mg by mouth every 6 (six) hours as needed for mild pain.   Past Week at Unknown time  . Fe Fum-FePoly-Vit C-Vit B3 (INTEGRA) 62.5-62.5-40-3 MG CAPS Take 1 capsule by mouth daily.   12/29/2017  at Unknown time  . ibuprofen (ADVIL,MOTRIN) 600 MG tablet Take 1 tablet (600 mg total) by mouth every 6 (six) hours. 30 tablet 1 01/07/2018 at 1200  . oxyCODONE (OXY IR/ROXICODONE) 5 MG immediate release tablet Take 1 tablet (5 mg total) by mouth every 4 (four) hours as needed for moderate pain or severe pain (pain scale greater than 5). 15 tablet 0 01/07/2018 at 1200  . Prenatal Vit-Fe Fumarate-FA (PRENATAL MULTIVITAMIN) TABS tablet Take 1 tablet by mouth at bedtime.   12/28/2017 at Unknown time    Review of Systems  Constitutional: Negative.  Negative for fatigue and fever.  HENT: Negative.   Respiratory: Negative.  Negative for shortness of breath.   Cardiovascular: Negative.  Negative for chest pain.  Gastrointestinal: Negative.  Negative for abdominal pain, constipation, diarrhea, nausea and vomiting.  Genitourinary: Negative.  Negative for dysuria, vaginal bleeding and vaginal discharge.  Musculoskeletal:       Shoulder pain  Neurological: Positive for dizziness and headaches.   Physical Exam   Blood pressure (!) 169/94, pulse 67, temperature 98.7 F (37.1 C), temperature source Oral, resp. rate 20, height 5'  2" (1.575 m), SpO2 98 %, unknown if currently breastfeeding.  Patient Vitals for the past 24 hrs:  BP Temp Temp src Pulse Resp SpO2 Height  01/07/18 1640 (!) 169/94 98.7 F (37.1 C) Oral 67 20 98 % 5\' 2"  (1.575 m)    Physical Exam  Nursing note and vitals reviewed. Constitutional: She is oriented to person, place, and time. She appears well-developed and well-nourished. No distress.  HENT:  Head: Normocephalic.  Eyes: Pupils are equal, round, and reactive to light.  Cardiovascular: Normal rate, regular rhythm and normal heart sounds.  Respiratory: Effort normal and breath sounds normal. No respiratory distress.  GI: Soft. Bowel sounds are normal. She exhibits no distension. There is no tenderness.  Musculoskeletal: She exhibits edema (bilateral lower extremity).   Neurological: She is alert and oriented to person, place, and time. She has normal reflexes. She displays normal reflexes. No cranial nerve deficit. She exhibits normal muscle tone. Coordination normal.  Skin: Skin is warm and dry.  Yellow tint to skin, patient reports doing a tumeric mask  Psychiatric: She has a normal mood and affect. Her behavior is normal. Judgment and thought content normal.    MAU Course  Procedures Results for orders placed or performed during the hospital encounter of 01/07/18 (from the past 24 hour(s))  Urinalysis, Routine w reflex microscopic     Status: Abnormal   Collection Time: 01/07/18  4:37 PM  Result Value Ref Range   Color, Urine YELLOW YELLOW   APPearance HAZY (A) CLEAR   Specific Gravity, Urine 1.011 1.005 - 1.030   pH 7.0 5.0 - 8.0   Glucose, UA NEGATIVE NEGATIVE mg/dL   Hgb urine dipstick LARGE (A) NEGATIVE   Bilirubin Urine NEGATIVE NEGATIVE   Ketones, ur NEGATIVE NEGATIVE mg/dL   Protein, ur NEGATIVE NEGATIVE mg/dL   Nitrite NEGATIVE NEGATIVE   Leukocytes, UA TRACE (A) NEGATIVE   RBC / HPF 6-10 0 - 5 RBC/hpf   WBC, UA 11-20 0 - 5 WBC/hpf   Bacteria, UA RARE (A) NONE SEEN   Squamous Epithelial / LPF 0-5 0 - 5   Mucus PRESENT   CBC     Status: Abnormal   Collection Time: 01/07/18  4:56 PM  Result Value Ref Range   WBC 8.1 4.0 - 10.5 K/uL   RBC 4.08 3.87 - 5.11 MIL/uL   Hemoglobin 10.8 (L) 12.0 - 15.0 g/dL   HCT 34.0 (L) 36.0 - 46.0 %   MCV 83.3 80.0 - 100.0 fL   MCH 26.5 26.0 - 34.0 pg   MCHC 31.8 30.0 - 36.0 g/dL   RDW 15.7 (H) 11.5 - 15.5 %   Platelets 377 150 - 400 K/uL   nRBC 0.0 0.0 - 0.2 %     MDM CBC, CMP, Protein/creat ratio Preeclampsia order set  Dr. Ernestina Patches consulted due to severe range BP and headache- recommends admission to [place] for magnesium   1655: Dr. Simona Huh paged 1713: Second page Dr. Simona Huh returned page and updated on patient presentation- agrees with recommendation of admission and CNM will put in orders  for magnesium  Assessment and Plan   1. Preeclampsia in postpartum period    -Admit to [facility] -Magnesium 4g bolus followed by 2g/hr -Care turned over to MD  [place] 01/07/2018, 4:46 PM

## 2018-01-07 NOTE — MAU Note (Signed)
Pt states she has been having upper back and rt shoulder pain since her delivery last week.  Pt rates pain 10/10.  Pt also reports swelling of lower ext. Since delivering baby.

## 2018-01-08 MED ORDER — HYDROCODONE-ACETAMINOPHEN 5-325 MG PO TABS
1.0000 | ORAL_TABLET | ORAL | Status: DC | PRN
  Administered 2018-01-08: 2 via ORAL
  Filled 2018-01-08: qty 2

## 2018-01-08 NOTE — Progress Notes (Signed)
S: Pt woke and reported to RN that right shoulder was hurting again. Shoulder pain feels like dull ache worse with movement, pt endorses she was pushing and movement around a lot before she went for her c/s. Used heat which helps a little, pt tried percocet at home with no relief, motrin help s a little. No numbness, has FROM.   O: BP 138/87 (BP Location: Right Arm)   Pulse 78   Temp 97.6 F (36.4 C) (Oral)   Resp 18   Ht 5\' 2"  (1.575 m)   Wt 72.6 kg   SpO2 97%   BMI 29.26 kg/m    A/P: I am under the impression this is musculoskeletal pain, will continue with heat, give norco PRN, gentle ROM. Continue with motrin and flexeril. Monitor. Denies s/sx of preeclampsia such as HA< vision changes or RUQ pain.

## 2018-01-08 NOTE — Progress Notes (Signed)
Julie Fleming [id number]   [facility] pt of [place].   Postpartum Postoperative Day # 9 from a primary C/S, Pt is a readmit PP for severe range BP and presented to MAU with pain behind her right shoulder blade x 5 days, Pt found to have severe range BP, dx with PP Preeclampsia, pt has h/o  GDM Insulin while pregnant has resolved since.   Julie Fleming, [id number], Unknown, S/P POD#9 from 3Primary LT Cesarean Section due to Suspected cephalopelvic disproportion, gestational diabetes A2, on insulin and oligohydramnios..   Subjective: Patient up ad lib, denies syncope or dizziness, has SCD in place. Reports consuming regular diet without issues and denies N/V. Patient reports 1 bowel movement + passing flatus.  Denies issues with urination and reports bleeding is "lighter."  Patient is Breastfeeding and reports going well. Pain is being appropriately managed with use of po meds. Pt sleeping well, currently denies CP, SOB, N, fever, back pain, no HA, vision changes, or RUQ pain. Back pain was felt last night and relieved with motrin and flexeril. Pt on magnesium now.    Objective: Patient Vitals for the past 24 hrs:  BP Temp Temp src Pulse Resp SpO2 Height Weight  01/08/18 0508 130/87 98.1 F (36.7 C) Oral 74 17 100 % - -  01/08/18 0213 122/73 - - 76 - 98 % - -  01/08/18 0100 120/60 - - 80 18 - - -  01/08/18 0009 115/62 98.9 F (37.2 C) Oral 88 18 97 % - -  01/07/18 2300 125/60 - - 88 18 - - -  01/07/18 2153 129/62 99.2 F (37.3 C) Oral 84 18 97 % - -  01/07/18 2040 (!) 143/89 - - 74 17 100 % - -  01/07/18 1933 (!) 143/93 98.5 F (36.9 C) - 75 17 100 % - -  01/07/18 1821 (!) 138/98 98.2 F (36.8 C) Oral 86 16 100 % 5\' 2"  (1.575 m) 72.6 kg  01/07/18 1800 (!) 146/81 - - 89 - 98 % - -  01/07/18 1755 - - - - - 100 % - -  01/07/18 1754 (!) 153/84 - - 79 - - - -  01/07/18 1753 - - - - - 100 % - -  01/07/18 1746 (!) 164/90 - - 75 - - - -  01/07/18 1745 - - - - - 100 % - -  01/07/18 1740  - - - - - 100 % - -  01/07/18 1735 - - - - - 100 % - -  01/07/18 1730 (!) 161/91 - - 66 - 99 % - -  01/07/18 1716 (!) 175/93 - - 71 - - - -  01/07/18 1704 (!) 174/97 - - 63 - - - -  01/07/18 1645 (!) 180/85 - - 64 - 97 % - -  01/07/18 1640 (!) 169/94 98.7 F (37.1 C) Oral 67 20 98 % 5\' 2"  (1.575 m) -  01/07/18 1637 (!) 169/94 - - 67 - - - -     Physical Exam:  General: alert, cooperative, appears stated age and no distress Mood/Affect: Happy Lungs: clear to auscultation, no wheezes, rales or rhonchi, symmetric air entry.  Heart: normal rate, regular rhythm, normal S1, S2, no murmurs, rubs, clicks or gallops. Breast: breasts appear normal, no suspicious masses, no skin or nipple changes or axillary nodes. Abdomen:  + bowel sounds, soft, non-tender Incision: healing well, no significant drainage, no dehiscence, no significant erythema, Steri strips dry intact.  Uterine Fundus: firm, involution to  symphis pubis  Lochia: appropriate Skin: Warm, Dry. DVT Evaluation: No evidence of DVT seen on physical exam. Negative Homan's sign. No cords or calf tenderness. No significant calf/ankle edema. 2+ Patellar DTR, No clonus SCD in place.   Labs: Recent Labs    01/07/18 1656  HGB 10.8*  HCT 34.0*  WBC 8.1    CBG (last 3)  No results for input(s): GLUCAP in the last 72 hours.   I/O: I/O last 3 completed shifts: In: 180 [P.O.:180] Out: 2175 [Urine:2175]   Assessment Postpartum Postoperative Day # 9, Day 1 of PP readmission. Julie Fleming, [id number], Unknown, S/P Primary LT Cesarean Section due to Suspected cephalopelvic disproportion, gestational diabetes A2, on insulin and oligohydramnios..  Pt stable. Fundus by symphis pubis. BreastFeeding. BP stable 130/70s, denies s/sx. Labs AST 22, ALT 26, Creatinine 0.52, HGB 10.8, Plat 377. PCR was 0.25. Back pain resolved.  Hemodynamically Stable.  Plan: Continue other mgmt as ordered PP Preeclampsia: Continue to monitor BP, neuro  checks per RN Q4H, Labs in morning, d/c magnesium @ 1800 tonight.  VTE Prophylactics: SCD, ambulated as tolerates.  Pain control: Motrin/Tylenol/Narcotics PRN Back pain: May take motrin and flexeril PRN. Use heat.   Plan for discharge tomorrow  Dr. Cletis Media to be updated on patient status  Taelon Bendorf NP-C, CNM 01/08/2018, 8:11 AM

## 2018-01-09 ENCOUNTER — Observation Stay (HOSPITAL_COMMUNITY): Payer: 59

## 2018-01-09 LAB — COMPREHENSIVE METABOLIC PANEL
ALK PHOS: 96 U/L (ref 38–126)
ALT: 19 U/L (ref 0–44)
AST: 14 U/L — ABNORMAL LOW (ref 15–41)
Albumin: 2.9 g/dL — ABNORMAL LOW (ref 3.5–5.0)
Anion gap: 9 (ref 5–15)
BILIRUBIN TOTAL: 0.5 mg/dL (ref 0.3–1.2)
BUN: <5 mg/dL — ABNORMAL LOW (ref 6–20)
CALCIUM: 7.8 mg/dL — AB (ref 8.9–10.3)
CO2: 24 mmol/L (ref 22–32)
CREATININE: 0.42 mg/dL — AB (ref 0.44–1.00)
Chloride: 108 mmol/L (ref 98–111)
GFR calc non Af Amer: >60 mL/min (ref >60)
Glucose, Bld: 96 mg/dL (ref 70–99)
Potassium: 2.9 mmol/L — ABNORMAL LOW (ref 3.5–5.1)
Sodium: 141 mmol/L (ref 135–145)
TOTAL PROTEIN: 6 g/dL — AB (ref 6.5–8.1)

## 2018-01-09 LAB — CBC WITH DIFFERENTIAL/PLATELET
BASOS ABS: 0 10*3/uL (ref 0.0–0.1)
Basophils Relative: 0 %
EOS ABS: 0.2 10*3/uL (ref 0.0–0.5)
EOS PCT: 3 %
HCT: 32.5 % — ABNORMAL LOW (ref 36.0–46.0)
Hemoglobin: 10.4 g/dL — ABNORMAL LOW (ref 12.0–15.0)
LYMPHS ABS: 2.7 10*3/uL (ref 0.7–4.0)
Lymphocytes Relative: 35 %
MCH: 26.7 pg (ref 26.0–34.0)
MCHC: 32 g/dL (ref 30.0–36.0)
MCV: 83.5 fL (ref 80.0–100.0)
Monocytes Absolute: 0.2 10*3/uL (ref 0.1–1.0)
Monocytes Relative: 3 %
NRBC: 0 % (ref 0.0–0.2)
Neutro Abs: 4.5 10*3/uL (ref 1.7–7.7)
Neutrophils Relative %: 59 %
PLATELETS: 374 10*3/uL (ref 150–400)
RBC: 3.89 MIL/uL (ref 3.87–5.11)
RDW: 16 % — ABNORMAL HIGH (ref 11.5–15.5)
WBC: 7.6 10*3/uL (ref 4.0–10.5)

## 2018-01-09 MED ORDER — NIFEDIPINE 10 MG PO CAPS
30.0000 mg | ORAL_CAPSULE | Freq: Once | ORAL | Status: AC
  Administered 2018-01-09: 30 mg via ORAL
  Filled 2018-01-09: qty 3

## 2018-01-09 MED ORDER — NIFEDIPINE ER OSMOTIC RELEASE 30 MG PO TB24
30.0000 mg | ORAL_TABLET | Freq: Every day | ORAL | Status: DC
  Administered 2018-01-09: 30 mg via ORAL
  Filled 2018-01-09: qty 1

## 2018-01-09 MED ORDER — NIFEDIPINE ER 30 MG PO TB24: 30.0000 mg | ORAL_TABLET | Freq: Every day | ORAL | 1 refills | Status: DC

## 2018-01-09 NOTE — Plan of Care (Signed)
  Problem: Education: Goal: Knowledge of disease or condition will improve Outcome: Progressing Goal: Knowledge of the prescribed therapeutic regimen will improve Outcome: Progressing   Problem: Fluid Volume: Goal: Peripheral tissue perfusion will improve Outcome: Progressing

## 2018-01-09 NOTE — Progress Notes (Signed)
[facility] notified of BP's pt rec 20mg  and 40 mg of labetalol ivp.  Pt c/o right shoulder pain 10 out of 10, pain increased when pt BP was elevated pt given 2 vicoden.   Orders received for procardia 30mg  po one time.

## 2018-01-09 NOTE — Progress Notes (Signed)
Subjective: Ms. Mcquire is a 32 y.o. G2 P1 who was readmitted with elevated B/Ps 01/07/18 s/p primary c/section. She  Initially presented for R shoulder pain which has not fully resolved. She rates this pain a 3-4/10 with PO pain meds on board. She denies visual changes, epigastric pain. Endorses a slight frontal headache. She wishes to go home tonight despite the unresolved shoulder pain.  Objective: Vital signs in last 24 hours: Temp:  [97.2 F (36.2 C)-98.6 F (37 C)] 98.5 F (36.9 C) (11/29 1536) Pulse Rate:  [68-91] 91 (11/29 1536) Resp:  [16-18] 17 (11/29 1536) BP: (104-172)/(58-101) 108/58 (11/29 1536) SpO2:  [97 %-100 %] 99 % (11/29 1536) Vitals:   01/09/18 0445 01/09/18 0822 01/09/18 1129 01/09/18 1536  BP: 104/64 108/74 111/60 (!) 108/58  Pulse: 87 87 69 91  Resp: 18 17 17 17   Temp: 98.1 F (36.7 C) 98.3 F (36.8 C) 98.4 F (36.9 C) 98.5 F (36.9 C)  TempSrc: Oral Oral Oral Oral  SpO2: 98% 100% 99% 99%  Weight:      Height:       Physical Exam:  Constitutional: She is oriented to person, place, and time. She appears well-developed and well-nourished. No distress.  HENT:  Head: Normocephalic and atraumatic.  Eyes: EOM are normal.  Neck: Normal range of motion.  Respiratory: Effort normal. No respiratory distress.  GI: Soft.  Skin: Skin is warm and dry.  Psychiatric: She has a normal mood and affect.     CBC Latest Ref Rng & Units 01/09/2018 01/07/2018 12/31/2017  WBC 4.0 - 10.5 K/uL 7.6 8.1 14.3(H)  Hemoglobin 12.0 - 15.0 g/dL 10.4(L) 10.8(L) 9.6(L)  Hematocrit 36.0 - 46.0 % 32.5(L) 34.0(L) 29.5(L)  Platelets 150 - 400 K/uL 374 377 204    Assessment Preeclampsia with SF, s/p magnesium gtt (off at 1800 on 01/09/18)  Normotensive R shoulder pain, unknown etiology. Most likely musculoskeletal. Stable  Plan: Chest Xray to evaluate R pain If negative, Discharge home on Procardia with close BP follow up per Dr. Alwyn Pea Pt has [facility] appt on  Tuesday  Larsen Zettel, CNM 01/09/2018, 4:14 PM

## 2018-01-09 NOTE — Lactation Note (Signed)
Lactation Consultation Note; Mom is readmit for shoulder pain and high BPs. Baby now 19 days old. Mom is pumping as I went into room. Reports she pumped 4 times yesterday. Encouraged to pump q 3 hours- 8 times/24 hours to maintain milk supply. Reports baby was latching well when she was in hospital the first time but once she went home she was having more trouble getting the baby to latch. Baby is not here now. Offered assist with latch and mom would like that. To call for assist when baby comes back and is ready for feeding.   Patient Name: Julie Fleming Date: 01/09/2018     Maternal Data    Feeding    LATCH Score                   Interventions    Lactation Tools Discussed/Used     Consult Status      Truddie Crumble 01/09/2018, 10:03 AM

## 2018-01-09 NOTE — Progress Notes (Signed)
S; Called by RN reporting increased pt BP, x2 doses of IV labetalol was required, BP at that time was 170/101, pt denies HA, vision changes or RUQ pain. Pt however did c/o 10/10 pain in right shoulder at this time. RN gave 2 Vicodin for pain control which helped to relieve pain earlier.   O:  Patient Vitals for the past 24 hrs:  BP Temp Temp src Pulse Resp SpO2  01/09/18 0022 (!) 153/84 - - 72 18 -  01/08/18 2357 (!) 172/83 - - 69 18 -  01/08/18 2327 (!) 170/101 - - 75 - -  01/08/18 2312 (!) 160/87 98.6 F (37 C) Oral 68 18 98 %  01/08/18 1900 (!) 145/84 98.4 F (36.9 C) Oral 85 18 97 %  01/08/18 1615 (!) 146/93 (!) 97.2 F (36.2 C) Oral 73 16 97 %  01/08/18 1212 131/87 (!) 97.4 F (36.3 C) Oral 86 18 95 %  01/08/18 0841 138/87 97.6 F (36.4 C) Oral 78 18 97 %  01/08/18 0508 130/87 98.1 F (36.7 C) Oral 74 17 100 %   A/P: POD#10 from [facility], readmitted for PP preeclampsia. Pt magnesium was turned off at 6pm on 11/28. Since pt was resting well. Woke and had elevated BP x2 doses of labetalol, and started on 30mg  procardia PO x1 dose now. BP post meds was 153/84, plan to monitor and start on daily procardia. If BP remain elevated pt to take 60mg  PO daily.    Dr Cletis Media updated about pt status and agrees with plan of care.

## 2018-01-09 NOTE — Discharge Summary (Addendum)
OB Discharge Summary     Julie Fleming is a 32 y.o. G2 P1 who was readmitted with elevated B/Ps 01/07/18 s/p primary c/section. She  Initially presented for R shoulder pain which has not fully resolved. She rates this pain a 3-4/10 with PO pain meds on board. She denies visual changes, epigastric pain. Endorses a slight frontal headache. She wishes to go home tonight despite the unresolved shoulder pain. Patient Name: Julie Fleming DOB: 01-18-1986 MRN: [id number]  Date of admission: 01/07/2018 Delivering MD: This patient has no babies on file.  Date of discharge: 01/09/2018  Admitting diagnosis: PP,SWELLING, BACK PAIN Intrauterine pregnancy: Unknown     Secondary diagnosis:  Active Problems:   Preeclampsia in postpartum period  Additional problems: R shoulder pain     Discharge diagnosis: Preeclampsia (severe)                                                                                                Post partum procedures:none     Physical exam  Vitals:   01/09/18 0445 01/09/18 0822 01/09/18 1129 01/09/18 1536  BP: 104/64 108/74 111/60 (!) 108/58  Pulse: 87 87 69 91  Resp: 18 17 17 17   Temp: 98.1 F (36.7 C) 98.3 F (36.8 C) 98.4 F (36.9 C) 98.5 F (36.9 C)  TempSrc: Oral Oral Oral Oral  SpO2: 98% 100% 99% 99%  Weight:      Height:       General: alert, cooperative and no distress Lochia: appropriate Uterine Fundus: firm Incision: Healing well with no significant drainage DVT Evaluation: Calf/Ankle edema is present Labs: Lab Results  Component Value Date   WBC 7.6 01/09/2018   HGB 10.4 (L) 01/09/2018   HCT 32.5 (L) 01/09/2018   MCV 83.5 01/09/2018   PLT 374 01/09/2018   CMP Latest Ref Rng & Units 01/09/2018  Glucose 70 - 99 mg/dL 96  BUN 6 - 20 mg/dL <5(L)  Creatinine 0.44 - 1.00 mg/dL 0.42(L)  Sodium 135 - 145 mmol/L 141  Potassium 3.5 - 5.1 mmol/L 2.9(L)  Chloride 98 - 111 mmol/L 108  CO2 22 - 32 mmol/L 24  Calcium 8.9 - 10.3 mg/dL  7.8(L)  Total Protein 6.5 - 8.1 g/dL 6.0(L)  Total Bilirubin 0.3 - 1.2 mg/dL 0.5  Alkaline Phos 38 - 126 U/L 96  AST 15 - 41 U/L 14(L)  ALT 0 - 44 U/L 19    Discharge instruction: per After Visit Summary and "Baby and Me Booklet".  After visit meds:  Allergies as of 01/09/2018   No Known Allergies     Medication List    STOP taking these medications   ibuprofen 600 MG tablet Commonly known as:  ADVIL,MOTRIN   oxyCODONE 5 MG immediate release tablet Commonly known as:  Oxy IR/ROXICODONE     TAKE these medications   NIFEdipine 30 MG 24 hr tablet Commonly known as:  ADALAT CC Take 1 tablet (30 mg total) by mouth daily. Start taking on:  01/10/2018       Diet: routine diet  Activity: Advance as tolerated. Pelvic rest for 6 weeks.  Outpatient follow VX:UCJARWP office visit at [facility] for B/P check Newborn Data: Baby Feeding: Breast Disposition:home with mother  Assessment Preeclampsia with SF, s/p magnesium gtt (off at 1800 on 01/08/18)  Normotensive R shoulder pain, unknown etiology. Most likely musculoskeletal. Stable Chest Xray negative  Plan: Discharge home on Procardia with close BP follow up per Dr. Alwyn Pea Pt has [facility] appt on Tuesday  01/09/2018 Altha Harm, CNM

## 2018-01-09 NOTE — Progress Notes (Signed)
Discharge instructions and prescriptions given to pt. Discussed signs and symptoms to report to the MD, upcoming appointments, and meds. Pt verbalizes understanding and has no questions or concerns at this time. Pt discharged from hospital in stable condition. 

## 2018-04-11 ENCOUNTER — Other Ambulatory Visit: Payer: Self-pay

## 2018-04-11 ENCOUNTER — Emergency Department (HOSPITAL_COMMUNITY)
Admission: EM | Admit: 2018-04-11 | Discharge: 2018-04-11 | Disposition: A | Discharge status: Home / Self Care | Payer: 59 | Attending: Emergency Medicine | Admitting: Emergency Medicine

## 2018-04-11 ENCOUNTER — Encounter (HOSPITAL_COMMUNITY): Payer: Self-pay

## 2018-04-11 DIAGNOSIS — N939 Abnormal uterine and vaginal bleeding, unspecified: Secondary | ICD-10-CM | POA: Diagnosis present

## 2018-04-11 DIAGNOSIS — Z79899 Other long term (current) drug therapy: Secondary | ICD-10-CM | POA: Insufficient documentation

## 2018-04-11 LAB — CBC WITH DIFFERENTIAL/PLATELET
ABS IMMATURE GRANULOCYTES: 0.02 10*3/uL (ref 0.00–0.07)
BASOS ABS: 0 10*3/uL (ref 0.0–0.1)
BASOS PCT: 0 %
Eosinophils Absolute: 0.2 10*3/uL (ref 0.0–0.5)
Eosinophils Relative: 2 %
HCT: 37.2 % (ref 36.0–46.0)
Hemoglobin: 11.5 g/dL — ABNORMAL LOW (ref 12.0–15.0)
IMMATURE GRANULOCYTES: 0 %
LYMPHS ABS: 3.9 10*3/uL (ref 0.7–4.0)
Lymphocytes Relative: 47 %
MCH: 24.7 pg — ABNORMAL LOW (ref 26.0–34.0)
MCHC: 30.9 g/dL (ref 30.0–36.0)
MCV: 79.8 fL — AB (ref 80.0–100.0)
MONOS PCT: 6 %
Monocytes Absolute: 0.5 10*3/uL (ref 0.1–1.0)
NEUTROS PCT: 45 %
NRBC: 0 % (ref 0.0–0.2)
Neutro Abs: 3.7 10*3/uL (ref 1.7–7.7)
PLATELETS: 293 10*3/uL (ref 150–400)
RBC: 4.66 MIL/uL (ref 3.87–5.11)
RDW: 14.2 % (ref 11.5–15.5)
WBC: 8.3 10*3/uL (ref 4.0–10.5)

## 2018-04-11 LAB — I-STAT BETA HCG BLOOD, ED (MC, WL, AP ONLY)

## 2018-04-11 LAB — BASIC METABOLIC PANEL
ANION GAP: 8 (ref 5–15)
BUN: 9 mg/dL (ref 6–20)
CALCIUM: 8.5 mg/dL — AB (ref 8.9–10.3)
CO2: 22 mmol/L (ref 22–32)
Chloride: 111 mmol/L (ref 98–111)
Creatinine, Ser: 0.45 mg/dL (ref 0.44–1.00)
GLUCOSE: 95 mg/dL (ref 70–99)
POTASSIUM: 3.6 mmol/L (ref 3.5–5.1)
SODIUM: 141 mmol/L (ref 135–145)

## 2018-04-11 NOTE — ED Provider Notes (Signed)
Physical Exam  BP (!) 135/105   Pulse 90   Temp 98.5 F (36.9 C) (Oral)   Resp 16   LMP 04/09/2018   SpO2 100%   Breastfeeding Yes   Physical Exam   Gen: resting comfortably in the bed in NAD.   ED Course/Procedures   Clinical Course as of Feb 29 1602  Sat Feb 29, 4953  255 33 year old female presents with complaint of vaginal bleeding.  Patient had a C-section in November 2019 for preeclampsia.  Patient has been breast-feeding, has not had a menstrual cycle since delivery.  Patient reports returning home from [place] last night with vaginal bleeding and reports passing clots.  Patient had reported she was bleeding through overnight pads within 1 hour however these are thin pads.  On exam patient has very mild amount of bleeding.  Patient has elevated blood pressure, heart rate normal.  CBC and chemistry ordered to evaluate for anemia, hCG due to vaginal bleeding.  No pelvic tenderness.  Care signed out to [place], [place] pending labs.   [LM]    Clinical Course User Index [LM] Tacy Learn, PA-C    Procedures  MDM   Patient signed out to me by L. Percell Miller, PA-C.  Please see previous notes for further history.  In brief, patient presenting for evaluation of vaginal bleeding x1 day.  Patient had a C-section due to preeclampsia in November 2019, ~3 months ago.  Since then, she has not had a period.  Last night, she started to have vaginal bleeding.  Patient was concerned due to the amount of bleeding, however on further investigation it appears that she has been using panty liners and not pads, and as such has had to be changing them frequently.  Vaginal exam is reassuring, minimal bleeding and no tenderness.  Plan to follow-up on labs to check CBC and for blood pressure recheck.  If reassuring, patient can be discharged and follow-up with OB/GYN.  Labs reassuring, hemoglobin improved from previous.  Repeat blood pressure improved.  Discussed findings with patient.  Discussed possibility  of increased heaviness of this.  Due to several months without a period.  Discussed importance of follow-up with OB/GYN for further evaluation.  At this time, patient appears safe for discharge.  Return precautions given.  Patient states she understands and agrees plan.  Results for orders placed or performed during the hospital encounter of 04/11/18  CBC with Differential  Result Value Ref Range   WBC 8.3 4.0 - 10.5 K/uL   RBC 4.66 3.87 - 5.11 MIL/uL   Hemoglobin 11.5 (L) 12.0 - 15.0 g/dL   HCT 37.2 36.0 - 46.0 %   MCV 79.8 (L) 80.0 - 100.0 fL   MCH 24.7 (L) 26.0 - 34.0 pg   MCHC 30.9 30.0 - 36.0 g/dL   RDW 14.2 11.5 - 15.5 %   Platelets 293 150 - 400 K/uL   nRBC 0.0 0.0 - 0.2 %   Neutrophils Relative % 45 %   Neutro Abs 3.7 1.7 - 7.7 K/uL   Lymphocytes Relative 47 %   Lymphs Abs 3.9 0.7 - 4.0 K/uL   Monocytes Relative 6 %   Monocytes Absolute 0.5 0.1 - 1.0 K/uL   Eosinophils Relative 2 %   Eosinophils Absolute 0.2 0.0 - 0.5 K/uL   Basophils Relative 0 %   Basophils Absolute 0.0 0.0 - 0.1 K/uL   Immature Granulocytes 0 %   Abs Immature Granulocytes 0.02 0.00 - 0.07 K/uL  I-Stat beta hCG  blood, ED  Result Value Ref Range   I-stat hCG, quantitative <5.0 <5 mIU/mL   Comment 3           No results found.      Franchot Heidelberg, PA-C 04/11/18 1651    Veryl Speak, MD 04/11/18 [phone]

## 2018-04-11 NOTE — Discharge Instructions (Addendum)
Your labs today were reassuring.  Your hemoglobin is slightly better than previous. Follow-up with your GYN for further evaluation if your vaginal bleeding continues. Return to the emergency room with any new, worsening, or concerning symptoms.

## 2018-04-11 NOTE — ED Notes (Signed)
Patient verbalizes understanding of discharge instructions. Opportunity for questioning and answers were provided. Armband removed by staff, pt discharged from ED.  

## 2018-04-11 NOTE — ED Provider Notes (Signed)
[facility]ERGENCY DEPARTMENT Provider Note   CSN: [id number] Arrival date & time: 04/11/18  1427    History   Chief Complaint Chief Complaint  Patient presents with  . Vaginal Bleeding    HPI Julie Fleming is a 33 y.o. female.     33 year old female presents with complaint of vaginal bleeding.  Patient states she had a C-section delivery for preeclampsia in November 2019, returned from a flight from [place] yesterday and began having vaginal bleeding last night.  Patient is using thin overnight pads and reports bleeding through 1 pad every hour and passing lemon-sized clots x3.  Patient denies any pain, states she feels a little lightheaded.  No other complaints or concerns.     Past Medical History:  Diagnosis Date  . Diabetes mellitus without complication ([facility])   . Gestational diabetes     Patient Active Problem List   Diagnosis Date Noted  . Preeclampsia in postpartum period 01/07/2018  . Gestational diabetes mellitus (GDM) in third trimester 12/29/2017    Past Surgical History:  Procedure Laterality Date  . CESAREAN SECTION N/A 12/30/2017   Procedure: CESAREAN SECTION;  Surgeon: Thurnell Lose, MD;  Location: [place];  Service: Obstetrics;  Laterality: N/A;  . NO PAST SURGERIES       OB History    Gravida  2   Para      Term      Preterm      AB  1   Living  1     SAB  1   TAB      Ectopic      Multiple      Live Births               Home Medications    Prior to Admission medications   Medication Sig Start Date End Date Taking? Authorizing Provider  NIFEdipine (ADALAT CC) 30 MG 24 hr tablet Take 1 tablet (30 mg total) by mouth daily. 01/10/18   [place], Melvyn Novas, CNM    Family History Family History  Problem Relation Age of Onset  . Diabetes Mother   . Hypertension Mother     Social History Social History   Tobacco Use  . Smoking status: Never Smoker  . Smokeless tobacco: Never Used    Substance Use Topics  . Alcohol use: No  . Drug use: No     Allergies   Patient has no known allergies.   Review of Systems Review of Systems  Constitutional: Negative for chills and fever.  Gastrointestinal: Negative for abdominal pain, constipation, diarrhea, nausea and vomiting.  Genitourinary: Positive for vaginal bleeding. Negative for dysuria, frequency, urgency and vaginal pain.  Musculoskeletal: Negative for arthralgias and myalgias.  Skin: Negative for rash and wound.  Allergic/Immunologic: Negative for immunocompromised state.  Neurological: Positive for light-headedness. Negative for weakness.  Hematological: Does not bruise/bleed easily.  Psychiatric/Behavioral: Negative for confusion.  All other systems reviewed and are negative.    Physical Exam Updated Vital Signs BP (!) 135/105   Pulse 90   Temp 98.5 F (36.9 C) (Oral)   Resp 16   LMP 04/09/2018   SpO2 100%   Breastfeeding Yes   Physical Exam Vitals signs and nursing note reviewed. Exam conducted with a chaperone present.  Constitutional:      General: She is not in acute distress.    Appearance: She is well-developed. She is not diaphoretic.  HENT:     Head: Normocephalic and atraumatic.  Cardiovascular:     Rate and Rhythm: Normal rate and regular rhythm.     Pulses: Normal pulses.     Heart sounds: Normal heart sounds.  Pulmonary:     Effort: Pulmonary effort is normal.     Breath sounds: Normal breath sounds.  Abdominal:     Palpations: Abdomen is soft.     Tenderness: There is no abdominal tenderness.  Genitourinary:    Cervix: Cervical bleeding present.     Adnexa: Right adnexa normal and left adnexa normal.     Comments: Scant blood in vaginal vault, small clot at os on exam. Musculoskeletal:     Right lower leg: No edema.     Left lower leg: No edema.  Skin:    General: Skin is warm and dry.     Capillary Refill: Capillary refill takes less than 2 seconds.     Coloration: Skin  is not pale.  Neurological:     Mental Status: She is alert and oriented to person, place, and time.  Psychiatric:        Behavior: Behavior normal.      ED Treatments / Results  Labs (all labs ordered are listed, but only abnormal results are displayed) Labs Reviewed  CBC WITH DIFFERENTIAL/PLATELET  BASIC METABOLIC PANEL  I-STAT BETA HCG BLOOD, ED ([facility], [facility], [facility] ONLY)    EKG None  Radiology No results found.  Procedures Procedures (including critical care time)  Medications Ordered in ED Medications - No data to display   Initial Impression / Assessment and Plan / ED Course  I have reviewed the triage vital signs and the nursing notes.  Pertinent labs & imaging results that were available during my care of the patient were reviewed by me and considered in my medical decision making (see chart for details).  Clinical Course as of Feb 29 1518  Sat Apr 11, 4266  6750 33 year old female presents with complaint of vaginal bleeding.  Patient had a C-section in November 2019 for preeclampsia.  Patient has been breast-feeding, has not had a menstrual cycle since delivery.  Patient reports returning home from [place] last night with vaginal bleeding and reports passing clots.  Patient had reported she was bleeding through overnight pads within 1 hour however these are thin pads.  On exam patient has very mild amount of bleeding.  Patient has elevated blood pressure, heart rate normal.  CBC and chemistry ordered to evaluate for anemia, hCG due to vaginal bleeding.  No pelvic tenderness.  Care signed out to [place], [place] pending labs.   [LM]    Clinical Course User Index [LM] Tacy Learn, PA-C   Final Clinical Impressions(s) / ED Diagnoses   Final diagnoses:  Vaginal bleeding    ED Discharge Orders    None       Tacy Learn, PA-C 04/11/18 1518    Veryl Speak, MD 04/11/18 [phone]

## 2018-04-11 NOTE — ED Triage Notes (Signed)
Csection in Nov 2019, breastfeeding, Onset 2 days ago first period.  Saturating 1 overnight pad q 2 hours, lemon size clots x 4.  Pt just returned 2 nights ago from [place], period started once reached [place].  No cold/cough/fever/sore throat,known sick contacts.

## 2018-10-20 IMAGING — US US OB COMP LESS 14 WK
1 series · 15 of 28 positions shown · non-contrast
Comparison: None.

CLINICAL DATA: Positive pregnancy test, vaginal bleeding.
Gestational age by last menstrual period 8 weeks and 1 days.

EXAM:
OBSTETRIC <14 WK US AND TRANSVAGINAL OB US
TECHNIQUE: Both transabdominal and transvaginal ultrasound examinations were
performed for complete evaluation of the gestation as well as the
maternal uterus, adnexal regions, and pelvic cul-de-sac.
Transvaginal technique was performed to assess early pregnancy.

[Series 1: us ob comp less 14 wk · 15 of 53 slices shown]
[im 1/53]
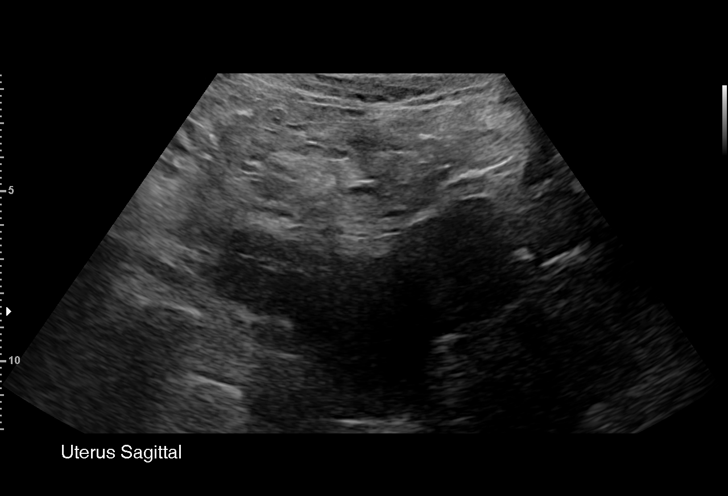
[im 4/53]
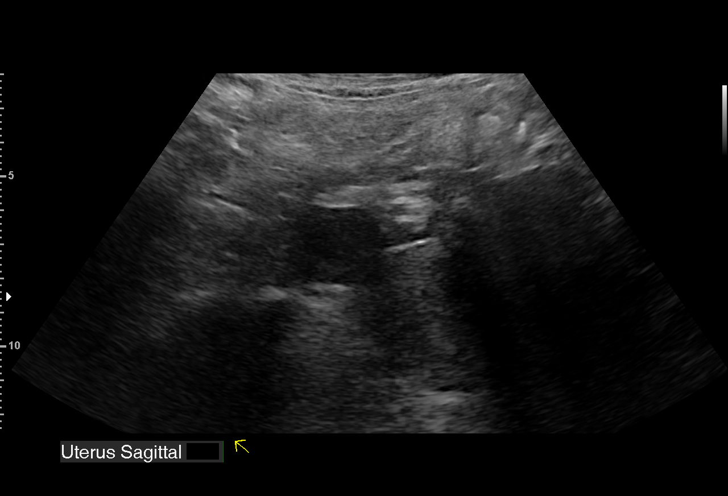
[im 8/53]
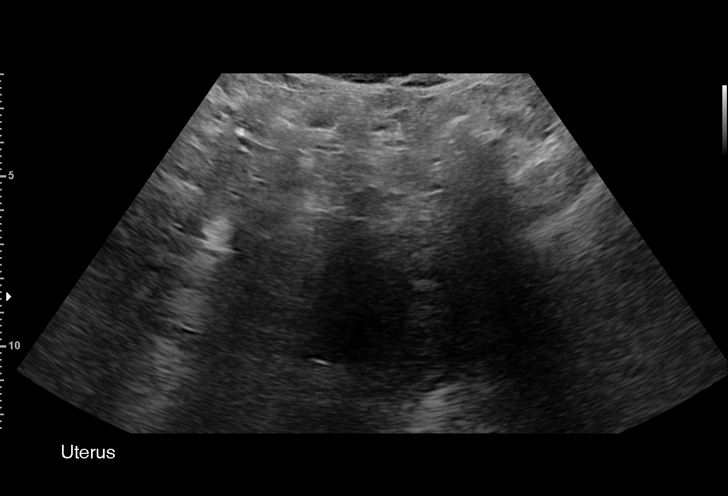
[im 12/53]
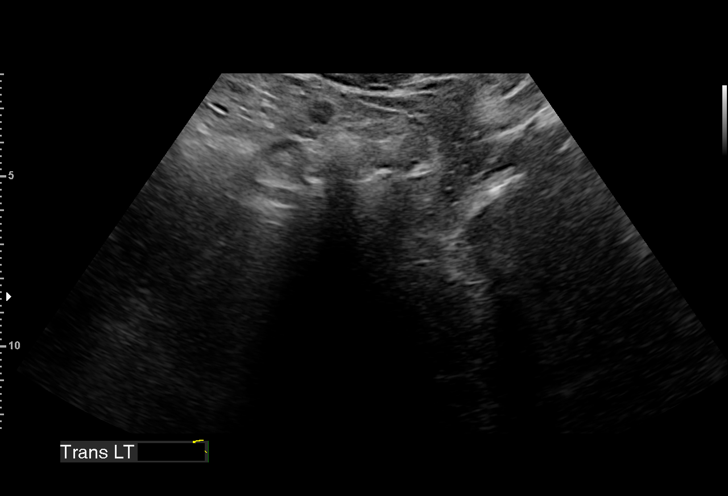
[im 16/53]
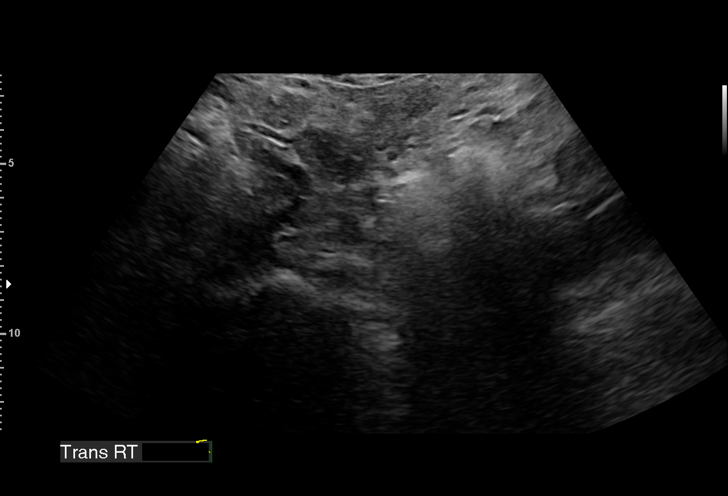
[im 20/53]
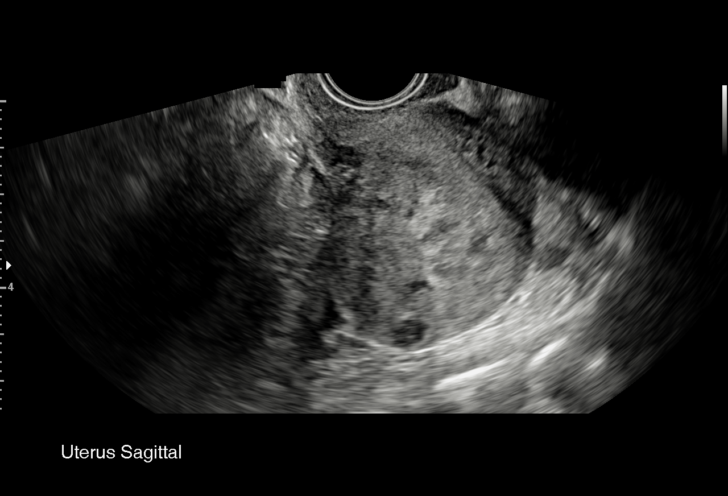
[im 24/53]
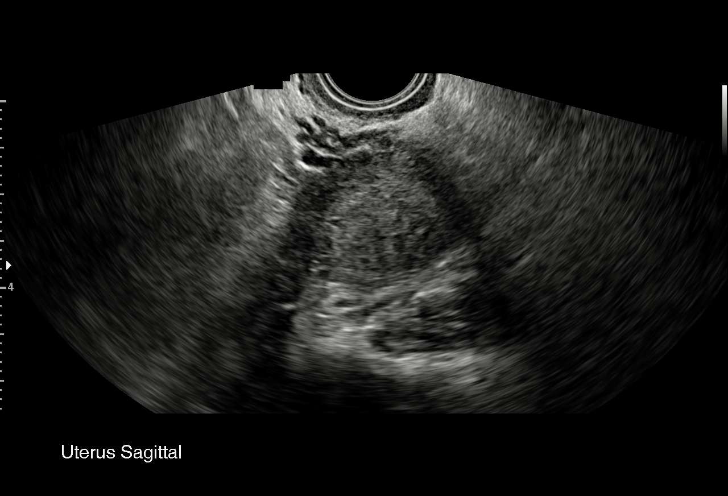
[im 27/53]
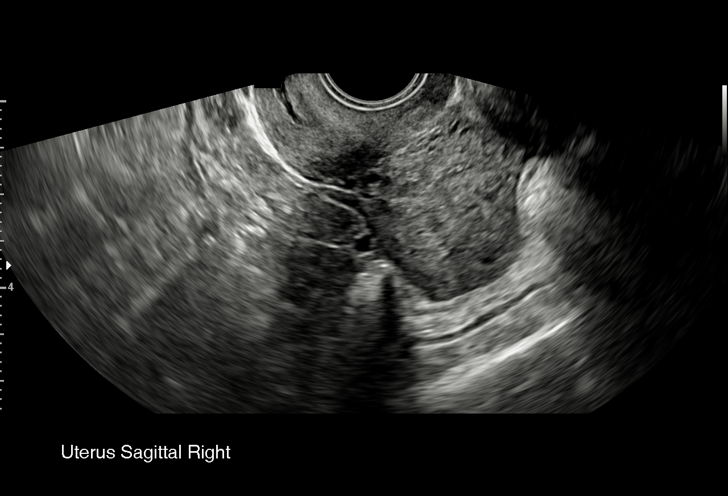
[im 29/53]
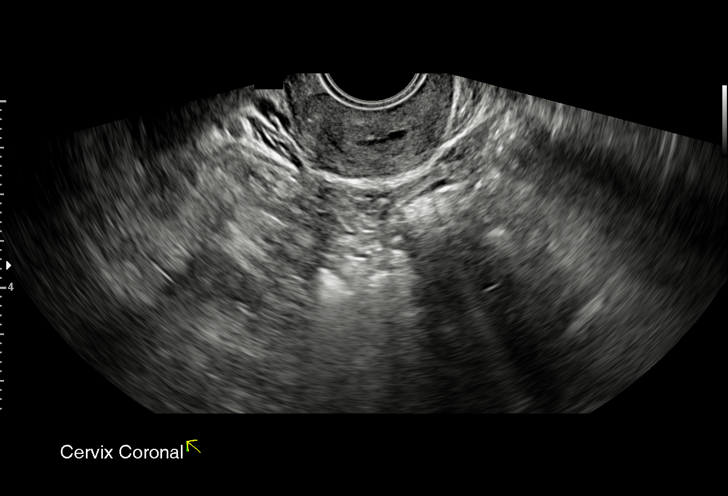
[im 33/53]
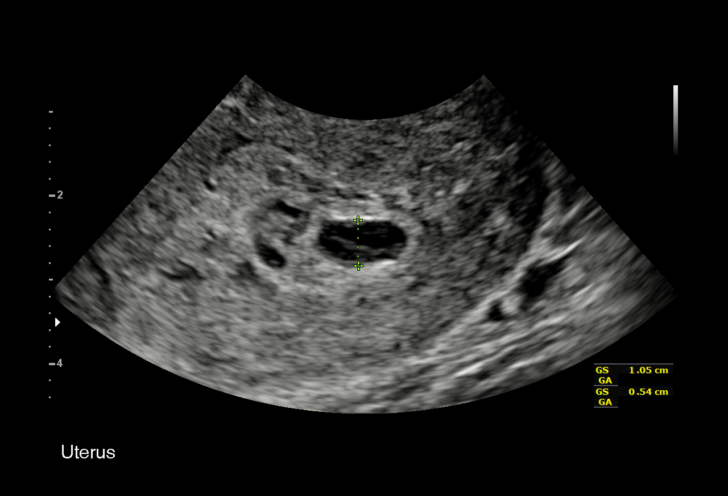
[im 37/53]
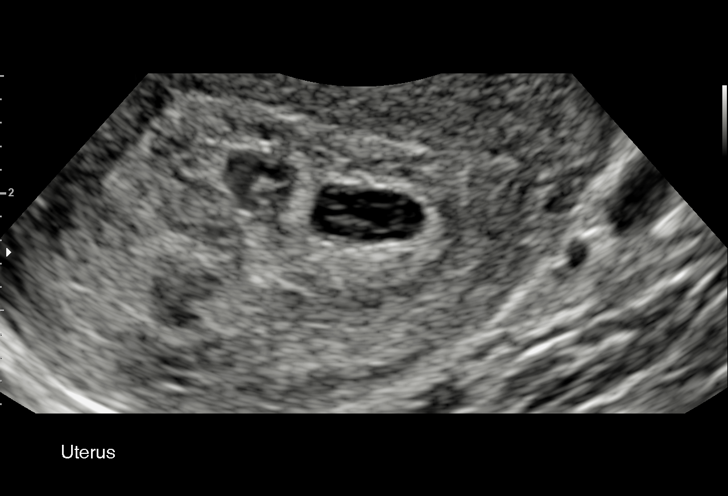
[im 41/53]
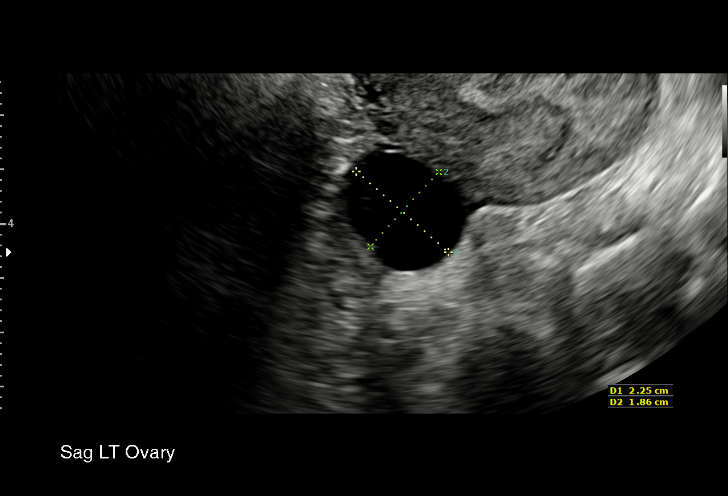
[im 45/53]
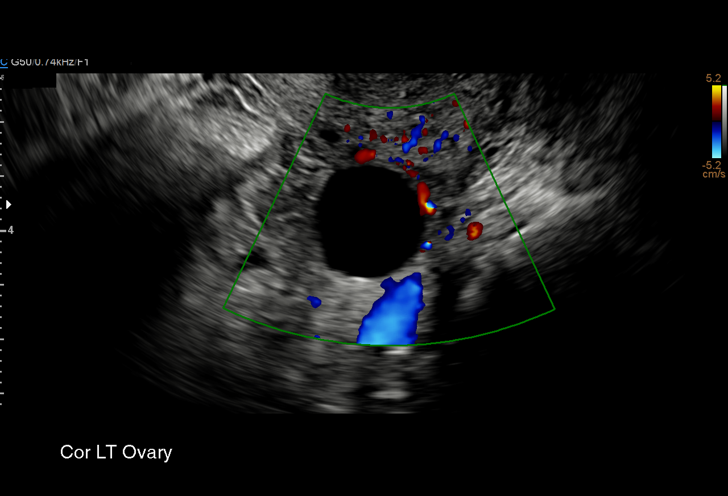
[im 49/53]
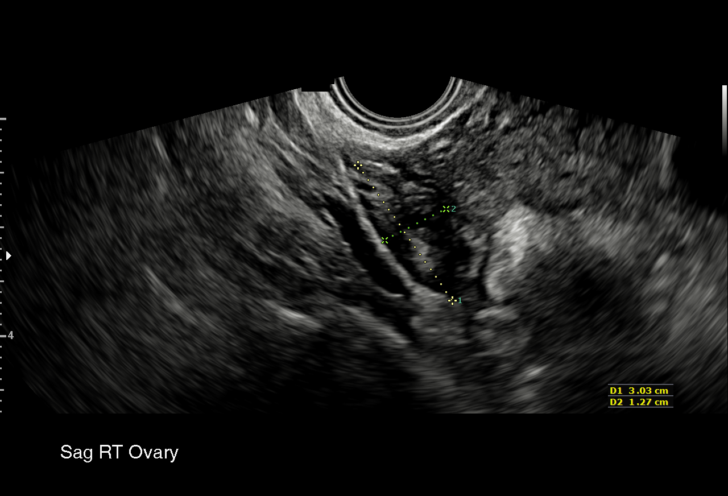
[im 53/53]
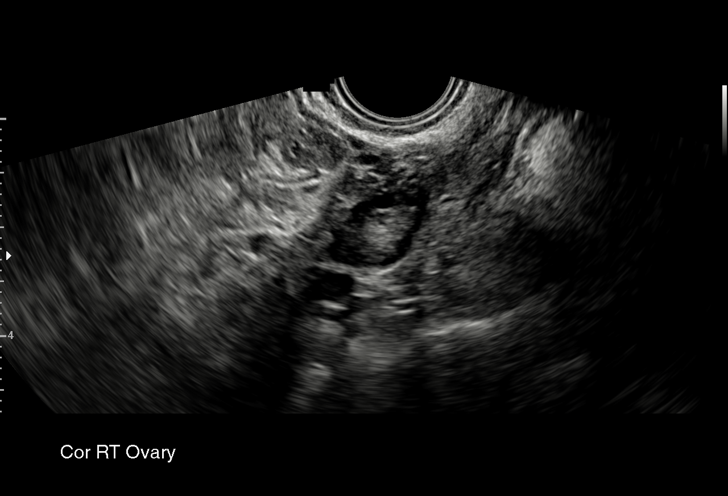

[15 of 28 positions shown; findings below may reference images not displayed]

FINDINGS: Intrauterine gestational sac: Present with minimal internal echoes.

Yolk sac:  Not present

Embryo:  Not present

Cardiac Activity: Not present

MSD: 8  mm   5 w   3  d

Subchorionic hemorrhage:  None visualized.

Maternal uterus/adnexae: 2.3 cm LEFT dominant follicle. Otherwise
unremarkable adnexae. Two subcentimeter hypoechoic intramural
leiomyomas.
IMPRESSION: Probable early intrauterine gestational sac, but no yolk sac, fetal
pole, or cardiac activity yet visualized. Recommend follow-up
quantitative B-HCG levels and follow-up US in 14 days to assess
viability. This recommendation follows SRU consensus guidelines:
Diagnostic Criteria for Nonviable Pregnancy Early in the First
Trimester. N Engl J Med 3323; [DATE].

## 2019-11-22 IMAGING — CR DG CHEST 2V
2 series · 2 of 2 positions shown · non-contrast
Comparison: 01/09/2018

CLINICAL DATA: Ten days post C-section.

EXAM:
CHEST - 2 VIEW

[chest pa]
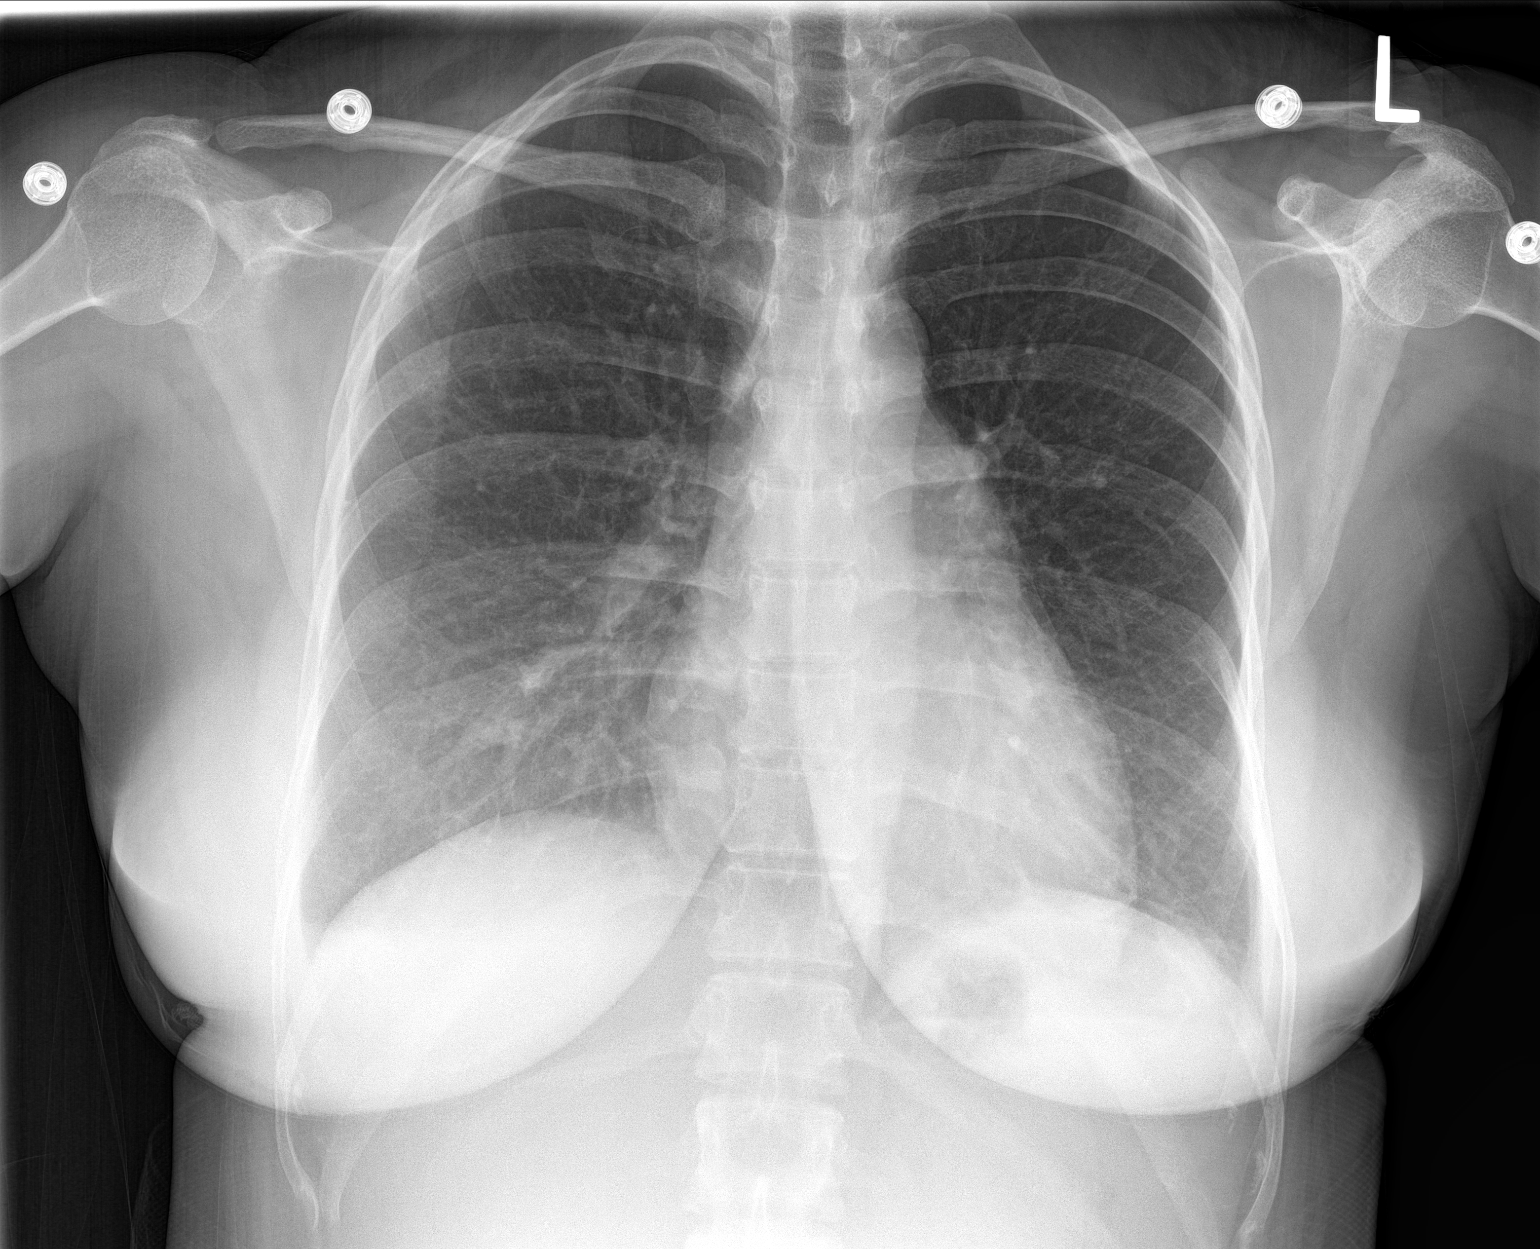

[chest lat]
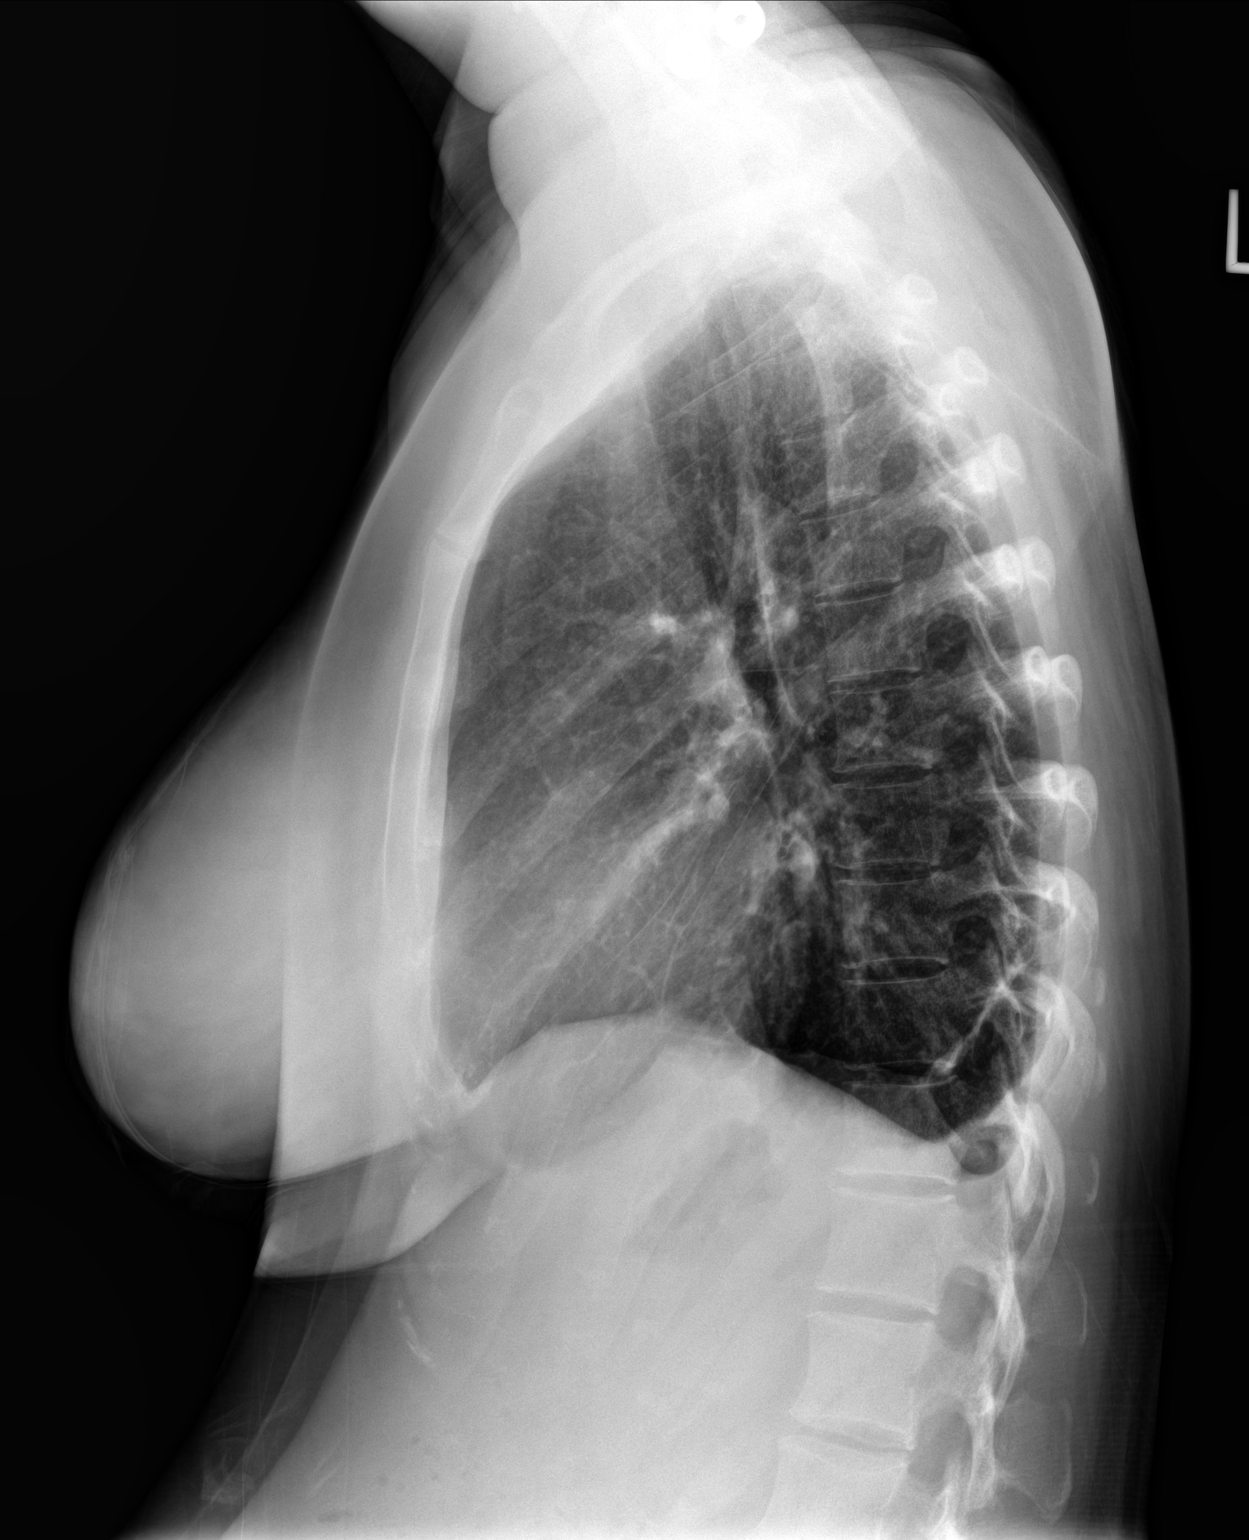

[2 of 2 positions shown; findings below may reference images not displayed]

FINDINGS: Normal heart size. No pleural effusion or edema. No airspace
opacities identified. The visualized osseous structures are
unremarkable.
IMPRESSION: No active cardiopulmonary disease.

## 2020-01-01 ENCOUNTER — Inpatient Hospital Stay (HOSPITAL_COMMUNITY): Payer: 59

## 2020-01-01 ENCOUNTER — Inpatient Hospital Stay (HOSPITAL_COMMUNITY)
Admission: AD | Admit: 2020-01-01 | Discharge: 2020-01-01 | Disposition: A | Discharge status: Home / Self Care | Payer: 59 | Attending: Obstetrics and Gynecology | Admitting: Obstetrics and Gynecology

## 2020-01-01 ENCOUNTER — Other Ambulatory Visit: Payer: Self-pay

## 2020-01-01 ENCOUNTER — Encounter (HOSPITAL_COMMUNITY): Payer: Self-pay | Admitting: Obstetrics and Gynecology

## 2020-01-01 DIAGNOSIS — R102 Pelvic and perineal pain: Secondary | ICD-10-CM | POA: Diagnosis not present

## 2020-01-01 DIAGNOSIS — Z3A01 Less than 8 weeks gestation of pregnancy: Secondary | ICD-10-CM

## 2020-01-01 DIAGNOSIS — R109 Unspecified abdominal pain: Secondary | ICD-10-CM | POA: Diagnosis not present

## 2020-01-01 DIAGNOSIS — O26891 Other specified pregnancy related conditions, first trimester: Secondary | ICD-10-CM | POA: Diagnosis not present

## 2020-01-01 DIAGNOSIS — Z3491 Encounter for supervision of normal pregnancy, unspecified, first trimester: Secondary | ICD-10-CM

## 2020-01-01 DIAGNOSIS — O26899 Other specified pregnancy related conditions, unspecified trimester: Secondary | ICD-10-CM

## 2020-01-01 LAB — URINALYSIS, ROUTINE W REFLEX MICROSCOPIC
Bilirubin Urine: NEGATIVE
Glucose, UA: NEGATIVE mg/dL
Hgb urine dipstick: NEGATIVE
Ketones, ur: NEGATIVE mg/dL
Nitrite: NEGATIVE
Protein, ur: NEGATIVE mg/dL
Specific Gravity, Urine: 1.009 (ref 1.005–1.030)
pH: 7 (ref 5.0–8.0)

## 2020-01-01 LAB — WET PREP, GENITAL
Clue Cells Wet Prep HPF POC: NONE SEEN
Sperm: NONE SEEN
Trich, Wet Prep: NONE SEEN
Yeast Wet Prep HPF POC: NONE SEEN

## 2020-01-01 LAB — CBC
HCT: 37.3 % (ref 36.0–46.0)
Hemoglobin: 12 g/dL (ref 12.0–15.0)
MCH: 25.5 pg — ABNORMAL LOW (ref 26.0–34.0)
MCHC: 32.2 g/dL (ref 30.0–36.0)
MCV: 79.4 fL — ABNORMAL LOW (ref 80.0–100.0)
Platelets: 325 10*3/uL (ref 150–400)
RBC: 4.7 MIL/uL (ref 3.87–5.11)
RDW: 14.2 % (ref 11.5–15.5)
WBC: 8.8 10*3/uL (ref 4.0–10.5)
nRBC: 0 % (ref 0.0–0.2)

## 2020-01-01 LAB — POCT PREGNANCY, URINE: Preg Test, Ur: POSITIVE — AB

## 2020-01-01 LAB — HCG, QUANTITATIVE, PREGNANCY: hCG, Beta Chain, Quant, S: 21041 m[IU]/mL — ABNORMAL HIGH (ref <5)

## 2020-01-01 NOTE — MAU Provider Note (Signed)
History     CSN: [id number]  Arrival date and time: 01/01/20 1052   First Provider Initiated Contact with Patient 01/01/20 1130      Chief Complaint  Patient presents with  . Abdominal Pain   HPI Julie Fleming is a 34 y.o. G3P1011 at [redacted]w[redacted]d who presents with lower abdominal cramping. She states the pain is an 8/10 and has not tried anything for the pain. She denies any vaginal bleeding or discharge. She has not been seen anywhere in this pregnancy yet.   OB History    Gravida  3   Para  1   Term  1   Preterm      AB  1   Living  1     SAB  1   TAB      Ectopic      Multiple      Live Births  1           Past Medical History:  Diagnosis Date  . Diabetes mellitus without complication ([place])   . Gestational diabetes     Past Surgical History:  Procedure Laterality Date  . CESAREAN SECTION N/A 12/30/2017   Procedure: CESAREAN SECTION;  Surgeon: Thurnell Lose, MD;  Location: [facility];  Service: Obstetrics;  Laterality: N/A;  . NO PAST SURGERIES      Family History  Problem Relation Age of Onset  . Diabetes Mother   . Hypertension Mother     Social History   Tobacco Use  . Smoking status: Never Smoker  . Smokeless tobacco: Never Used  Vaping Use  . Vaping Use: Never used  Substance Use Topics  . Alcohol use: No  . Drug use: No    Allergies: No Known Allergies  Medications Prior to Admission  Medication Sig Dispense Refill Last Dose  . NIFEdipine (ADALAT CC) 30 MG 24 hr tablet Take 1 tablet (30 mg total) by mouth daily. 60 tablet 1     Review of Systems  Constitutional: Negative.  Negative for fatigue and fever.  HENT: Negative.   Respiratory: Negative.  Negative for shortness of breath.   Cardiovascular: Negative.  Negative for chest pain.  Gastrointestinal: Positive for abdominal pain. Negative for constipation, diarrhea, nausea and vomiting.  Genitourinary: Negative.  Negative for dysuria, vaginal bleeding and  vaginal discharge.  Neurological: Negative.  Negative for dizziness and headaches.   Physical Exam   Blood pressure 117/70, pulse 85, temperature 98.2 F (36.8 C), temperature source Oral, resp. rate 16, height 5\' 3"  (1.6 m), weight 59.7 kg, last menstrual period 11/23/2019, SpO2 100 %, currently breastfeeding.  Physical Exam Vitals and nursing note reviewed.  Constitutional:      General: She is not in acute distress.    Appearance: She is well-developed.  HENT:     Head: Normocephalic.  Eyes:     Pupils: Pupils are equal, round, and reactive to light.  Cardiovascular:     Rate and Rhythm: Normal rate and regular rhythm.     Heart sounds: Normal heart sounds.  Pulmonary:     Effort: Pulmonary effort is normal. No respiratory distress.     Breath sounds: Normal breath sounds.  Abdominal:     General: Bowel sounds are normal. There is no distension.     Palpations: Abdomen is soft.     Tenderness: There is no abdominal tenderness.  Skin:    General: Skin is warm and dry.  Neurological:     Mental Status: She is alert  and oriented to person, place, and time.  Psychiatric:        Behavior: Behavior normal.        Thought Content: Thought content normal.        Judgment: Judgment normal.     MAU Course  Procedures Results for orders placed or performed during the hospital encounter of 01/01/20 (from the past 24 hour(s))  Pregnancy, urine POC     Status: Abnormal   Collection Time: 01/01/20 11:01 AM  Result Value Ref Range   Preg Test, Ur POSITIVE (A) NEGATIVE  Urinalysis, Routine w reflex microscopic Urine, Clean Catch     Status: Abnormal   Collection Time: 01/01/20 11:02 AM  Result Value Ref Range   Color, Urine YELLOW YELLOW   APPearance CLEAR CLEAR   Specific Gravity, Urine 1.009 1.005 - 1.030   pH 7.0 5.0 - 8.0   Glucose, UA NEGATIVE NEGATIVE mg/dL   Hgb urine dipstick NEGATIVE NEGATIVE   Bilirubin Urine NEGATIVE NEGATIVE   Ketones, ur NEGATIVE NEGATIVE mg/dL    Protein, ur NEGATIVE NEGATIVE mg/dL   Nitrite NEGATIVE NEGATIVE   Leukocytes,Ua TRACE (A) NEGATIVE   RBC / HPF 0-5 0 - 5 RBC/hpf   WBC, UA 0-5 0 - 5 WBC/hpf   Bacteria, UA RARE (A) NONE SEEN   Squamous Epithelial / LPF 0-5 0 - 5   Mucus PRESENT   CBC     Status: Abnormal   Collection Time: 01/01/20 11:20 AM  Result Value Ref Range   WBC 8.8 4.0 - 10.5 K/uL   RBC 4.70 3.87 - 5.11 MIL/uL   Hemoglobin 12.0 12.0 - 15.0 g/dL   HCT 37.3 36 - 46 %   MCV 79.4 (L) 80.0 - 100.0 fL   MCH 25.5 (L) 26.0 - 34.0 pg   MCHC 32.2 30.0 - 36.0 g/dL   RDW 14.2 11.5 - 15.5 %   Platelets 325 150 - 400 K/uL   nRBC 0.0 0.0 - 0.2 %  hCG, quantitative, pregnancy     Status: Abnormal   Collection Time: 01/01/20 11:20 AM  Result Value Ref Range   hCG, Beta Chain, Quant, S 21,041 (H) <5 mIU/mL  Wet prep, genital     Status: Abnormal   Collection Time: 01/01/20 11:43 AM  Result Value Ref Range   Yeast Wet Prep HPF POC NONE SEEN NONE SEEN   Trich, Wet Prep NONE SEEN NONE SEEN   Clue Cells Wet Prep HPF POC NONE SEEN NONE SEEN   WBC, Wet Prep HPF POC MANY (A) NONE SEEN   Sperm NONE SEEN    US OB LESS THAN 14 WEEKS WITH OB TRANSVAGINAL  Result Date: 01/01/2020 CLINICAL DATA:  Pregnant patient with pain. EXAM: OBSTETRIC <14 WK [place] AND TRANSVAGINAL OB US TECHNIQUE: Both transabdominal and transvaginal ultrasound examinations were performed for complete evaluation of the gestation as well as the maternal uterus, adnexal regions, and pelvic cul-de-sac. Transvaginal technique was performed to assess early pregnancy. COMPARISON:  None. FINDINGS: Intrauterine gestational sac: Single Yolk sac:  Visualized. Embryo:  Not Visualized. MSD: 10.9 mm mm   5 w   5 d CRL:    mm    w    d                  [place] EDC: Subchorionic hemorrhage:  None visualized. Maternal uterus/adnexae: The ovaries are normal in appearance. No free fluid in the pelvis. IMPRESSION: An intrauterine pregnancy is identified with a gestational sac and yolk  sac. A fetal pole is not yet seen. Electronically Signed   By: Dorise Bullion III M.D   On: 01/01/2020 13:19   MDM UA, UPT CBC, HCG ABO/Rh- B Pos Wet prep and gc/chlamydia US OB Comp Less 14 weeks with Transvaginal   Assessment and Plan   1. Normal intrauterine pregnancy on prenatal ultrasound in first trimester   2. Abdominal pain affecting pregnancy   3. [redacted] weeks gestation of pregnancy    -Discharge home in stable condition -First trimester precautions discussed -Patient advised to follow-up with OB as scheduled  -Patient may return to MAU as needed or if her condition were to change or worsen   Wende Mott CNM 01/01/2020, 11:30 AM

## 2020-01-01 NOTE — Discharge Instructions (Signed)
First Trimester of Pregnancy The first trimester of pregnancy is from week 1 until the end of week 13 (months 1 through 3). A week after a sperm fertilizes an egg, the egg will implant on the wall of the uterus. This embryo will begin to develop into a baby. Genes from you and your partner will form the baby. The female genes will determine whether the baby will be a boy or a girl. At 6-8 weeks, the eyes and face will be formed, and the heartbeat can be seen on ultrasound. At the end of 12 weeks, all the baby's organs will be formed. Now that you are pregnant, you will want to do everything you can to have a healthy baby. Two of the most important things are to get good prenatal care and to follow your health care provider's instructions. Prenatal care is all the medical care you receive before the baby's birth. This care will help prevent, find, and treat any problems during the pregnancy and childbirth. Body changes during your first trimester Your body goes through many changes during pregnancy. The changes vary from woman to woman.  You may gain or lose a couple of pounds at first.  You may feel sick to your stomach (nauseous) and you may throw up (vomit). If the vomiting is uncontrollable, call your health care provider.  You may tire easily.  You may develop headaches that can be relieved by medicines. All medicines should be approved by your health care provider.  You may urinate more often. Painful urination may mean you have a bladder infection.  You may develop heartburn as a result of your pregnancy.  You may develop constipation because certain hormones are causing the muscles that push stool through your intestines to slow down.  You may develop hemorrhoids or swollen veins (varicose veins).  Your breasts may begin to grow larger and become tender. Your nipples may stick out more, and the tissue that surrounds them (areola) may become darker.  Your gums may bleed and may be  sensitive to brushing and flossing.  Dark spots or blotches (chloasma, mask of pregnancy) may develop on your face. This will likely fade after the baby is born.  Your menstrual periods will stop.  You may have a loss of appetite.  You may develop cravings for certain kinds of food.  You may have changes in your emotions from day to day, such as being excited to be pregnant or being concerned that something may go wrong with the pregnancy and baby.  You may have more vivid and strange dreams.  You may have changes in your hair. These can include thickening of your hair, rapid growth, and changes in texture. Some women also have hair loss during or after pregnancy, or hair that feels dry or thin. Your hair will most likely return to normal after your baby is born. What to expect at prenatal visits During a routine prenatal visit:  You will be weighed to make sure you and the baby are growing normally.  Your blood pressure will be taken.  Your abdomen will be measured to track your baby's growth.  The fetal heartbeat will be listened to between weeks 10 and 14 of your pregnancy.  Test results from any previous visits will be discussed. Your health care provider may ask you:  How you are feeling.  If you are feeling the baby move.  If you have had any abnormal symptoms, such as leaking fluid, bleeding, severe headaches, or abdominal   cramping.  If you are using any tobacco products, including cigarettes, chewing tobacco, and electronic cigarettes.  If you have any questions. Other tests that may be performed during your first trimester include:  Blood tests to find your blood type and to check for the presence of any previous infections. The tests will also be used to check for low iron levels (anemia) and protein on red blood cells (Rh antibodies). Depending on your risk factors, or if you previously had diabetes during pregnancy, you may have tests to check for high blood sugar  that affects pregnant women (gestational diabetes).  Urine tests to check for infections, diabetes, or protein in the urine.  An ultrasound to confirm the proper growth and development of the baby.  Fetal screens for spinal cord problems (spina bifida) and Down syndrome.  HIV (human immunodeficiency virus) testing. Routine prenatal testing includes screening for HIV, unless you choose not to have this test.  You may need other tests to make sure you and the baby are doing well. Follow these instructions at home: Medicines  Follow your health care provider's instructions regarding medicine use. Specific medicines may be either safe or unsafe to take during pregnancy.  Take a prenatal vitamin that contains at least 600 micrograms (mcg) of folic acid.  If you develop constipation, try taking a stool softener if your health care provider approves. Eating and drinking   Eat a balanced diet that includes fresh fruits and vegetables, whole grains, good sources of protein such as meat, eggs, or tofu, and low-fat dairy. Your health care provider will help you determine the amount of weight gain that is right for you.  Avoid raw meat and uncooked cheese. These carry germs that can cause birth defects in the baby.  Eating four or five small meals rather than three large meals a day may help relieve nausea and vomiting. If you start to feel nauseous, eating a few soda crackers can be helpful. Drinking liquids between meals, instead of during meals, also seems to help ease nausea and vomiting.  Limit foods that are high in fat and processed sugars, such as fried and sweet foods.  To prevent constipation: ? Eat foods that are high in fiber, such as fresh fruits and vegetables, whole grains, and beans. ? Drink enough fluid to keep your urine clear or pale yellow. Activity  Exercise only as directed by your health care provider. Most women can continue their usual exercise routine during  pregnancy. Try to exercise for 30 minutes at least 5 days a week. Exercising will help you: ? Control your weight. ? Stay in shape. ? Be prepared for labor and delivery.  Experiencing pain or cramping in the lower abdomen or lower back is a good sign that you should stop exercising. Check with your health care provider before continuing with normal exercises.  Try to avoid standing for long periods of time. Move your legs often if you must stand in one place for a long time.  Avoid heavy lifting.  Wear low-heeled shoes and practice good posture.  You may continue to have sex unless your health care provider tells you not to. Relieving pain and discomfort  Wear a good support bra to relieve breast tenderness.  Take warm sitz baths to soothe any pain or discomfort caused by hemorrhoids. Use hemorrhoid cream if your health care provider approves.  Rest with your legs elevated if you have leg cramps or low back pain.  If you develop varicose veins in   your legs, wear support hose. Elevate your feet for 15 minutes, 3-4 times a day. Limit salt in your diet. Prenatal care  Schedule your prenatal visits by the twelfth week of pregnancy. They are usually scheduled monthly at first, then more often in the last 2 months before delivery.  Write down your questions. Take them to your prenatal visits.  Keep all your prenatal visits as told by your health care provider. This is important. Safety  Wear your seat belt at all times when driving.  Make a list of emergency phone numbers, including numbers for family, friends, the hospital, and police and fire departments. General instructions  Ask your health care provider for a referral to a local prenatal education class. Begin classes no later than the beginning of month 6 of your pregnancy.  Ask for help if you have counseling or nutritional needs during pregnancy. Your health care provider can offer advice or refer you to specialists for help  with various needs.  Do not use hot tubs, steam rooms, or saunas.  Do not douche or use tampons or scented sanitary pads.  Do not cross your legs for long periods of time.  Avoid cat litter boxes and soil used by cats. These carry germs that can cause birth defects in the baby and possibly loss of the fetus by miscarriage or stillbirth.  Avoid all smoking, herbs, alcohol, and medicines not prescribed by your health care provider. Chemicals in these products affect the formation and growth of the baby.  Do not use any products that contain nicotine or tobacco, such as cigarettes and e-cigarettes. If you need help quitting, ask your health care provider. You may receive counseling support and other resources to help you quit.  Schedule a dentist appointment. At home, brush your teeth with a soft toothbrush and be gentle when you floss. Contact a health care provider if:  You have dizziness.  You have mild pelvic cramps, pelvic pressure, or nagging pain in the abdominal area.  You have persistent nausea, vomiting, or diarrhea.  You have a bad smelling vaginal discharge.  You have pain when you urinate.  You notice increased swelling in your face, hands, legs, or ankles.  You are exposed to fifth disease or chickenpox.  You are exposed to German measles (rubella) and have never had it. Get help right away if:  You have a fever.  You are leaking fluid from your vagina.  You have spotting or bleeding from your vagina.  You have severe abdominal cramping or pain.  You have rapid weight gain or loss.  You vomit blood or material that looks like coffee grounds.  You develop a severe headache.  You have shortness of breath.  You have any kind of trauma, such as from a fall or a car accident. Summary  The first trimester of pregnancy is from week 1 until the end of week 13 (months 1 through 3).  Your body goes through many changes during pregnancy. The changes vary from  woman to woman.  You will have routine prenatal visits. During those visits, your health care provider will examine you, discuss any test results you may have, and talk with you about how you are feeling. This information is not intended to replace advice given to you by your health care provider. Make sure you discuss any questions you have with your health care provider. Document Revised: 01/10/2017 Document Reviewed: 01/10/2016 Elsevier Patient Education  2020 [employer]  

## 2020-01-01 NOTE — MAU Note (Signed)
Julie Fleming is a 34 y.o. at [redacted]w[redacted]d here in MAU reporting: lower abdominal pain for the past 3-4 days. No bleeding or abnormal discharge. Had + UPT when she was around [redacted] weeks pregnant.   LMP: 11/23/19  Onset of complaint: 3-4 days  Pain score: 6/10  Vitals:   01/01/20 1109  BP: 117/70  Pulse: 85  Resp: 16  Temp: 98.2 F (36.8 C)  SpO2: 100%     Lab orders placed from triage: UA, UPT

## 2020-01-03 LAB — GC/CHLAMYDIA PROBE AMP (~~LOC~~) NOT AT ARMC
Chlamydia: NEGATIVE
Comment: NEGATIVE
Comment: NORMAL
Neisseria Gonorrhea: NEGATIVE

## 2020-01-13 LAB — OB RESULTS CONSOLE RPR: RPR: NONREACTIVE

## 2020-01-13 LAB — OB RESULTS CONSOLE HEPATITIS B SURFACE ANTIGEN: Hepatitis B Surface Ag: NEGATIVE

## 2020-01-13 LAB — OB RESULTS CONSOLE ABO/RH: RH Type: POSITIVE

## 2020-01-13 LAB — OB RESULTS CONSOLE RUBELLA ANTIBODY, IGM: Rubella: IMMUNE

## 2020-01-13 LAB — OB RESULTS CONSOLE HIV ANTIBODY (ROUTINE TESTING): HIV: NONREACTIVE

## 2020-06-14 ENCOUNTER — Encounter: Payer: 59 | Attending: Obstetrics and Gynecology | Admitting: Registered"

## 2020-06-14 ENCOUNTER — Other Ambulatory Visit: Payer: Self-pay

## 2020-06-14 DIAGNOSIS — O24410 Gestational diabetes mellitus in pregnancy, diet controlled: Secondary | ICD-10-CM | POA: Insufficient documentation

## 2020-06-16 ENCOUNTER — Encounter: Payer: Self-pay | Admitting: Registered"

## 2020-06-16 NOTE — Progress Notes (Signed)
Patient was seen on 06/14/2020 for Gestational Diabetes self-management class at the [facility]. The following learning objectives were met by the patient during this course:   States the definition of Gestational Diabetes  States why dietary management is important in controlling blood glucose  Describes the effects each nutrient has on blood glucose levels  Demonstrates ability to create a balanced meal plan  Demonstrates carbohydrate counting   States when to check blood glucose levels  Demonstrates proper blood glucose monitoring techniques  States the effect of stress and exercise on blood glucose levels  States the importance of limiting caffeine and abstaining from alcohol and smoking  Blood glucose monitor given: Patient has meter prior to class and is checking blood sugar prior to class  Patient instructed to monitor glucose levels: FBS: 60 - <95; 1 hour: <140; 2 hour: <120  Patient received handouts:  Nutrition Diabetes and Pregnancy, including carb counting list  Patient will be seen for follow-up as needed.

## 2020-08-10 NOTE — Patient Instructions (Addendum)
Dhriti Prete  08/10/2020   Your procedure is scheduled on:  08/22/2020  Arrive at [facility] on [address] at [facility]  and [address]. You are invited to use the FREE valet parking or use the Visitor's parking deck.  Pick up the phone at the desk and dial [phone].  Call this number if you have problems the morning of surgery: [phone]  Remember:   Do not eat food:(After Midnight) Desps de medianoche.  Do not drink clear liquids: (After Midnight) Desps de medianoche.  Take these medicines the morning of surgery with A SIP OF WATER:  Take 8 units of NPH insulin the night before surgery instead of 16 units.  Take the prescribed amount of regular insulin that night.  NO INSULIN THE DAY OF SURGERY   Do not wear jewelry, make-up or nail polish.  Do not wear lotions, powders, or perfumes. Do not wear deodorant.  Do not shave 48 hours prior to surgery.  Do not bring valuables to the hospital.  [facility] is not   responsible for any belongings or valuables brought to the hospital.  Contacts, dentures or bridgework may not be worn into surgery.  Leave suitcase in the car. After surgery it may be brought to your room.  For patients admitted to the hospital, checkout time is 11:00 AM the day of              discharge.      Please read over the following fact sheets that you were given:     Preparing for Surgery

## 2020-08-16 ENCOUNTER — Encounter (HOSPITAL_COMMUNITY): Payer: Self-pay

## 2020-08-20 ENCOUNTER — Other Ambulatory Visit: Payer: Self-pay | Admitting: Obstetrics and Gynecology

## 2020-08-21 ENCOUNTER — Other Ambulatory Visit: Payer: Self-pay

## 2020-08-21 ENCOUNTER — Encounter (HOSPITAL_COMMUNITY)
Admission: RE | Admit: 2020-08-21 | Discharge: 2020-08-21 | Disposition: A | Discharge status: Home / Self Care | Payer: 59 | Source: Ambulatory Visit | Attending: Obstetrics and Gynecology | Admitting: Obstetrics and Gynecology

## 2020-08-21 ENCOUNTER — Encounter (HOSPITAL_COMMUNITY): Payer: Self-pay | Admitting: Obstetrics and Gynecology

## 2020-08-21 ENCOUNTER — Other Ambulatory Visit (HOSPITAL_COMMUNITY)
Admission: RE | Admit: 2020-08-21 | Discharge: 2020-08-21 | Disposition: A | Discharge status: Home / Self Care | Payer: 59 | Source: Ambulatory Visit | Attending: Obstetrics and Gynecology | Admitting: Obstetrics and Gynecology

## 2020-08-21 DIAGNOSIS — Z20822 Contact with and (suspected) exposure to covid-19: Secondary | ICD-10-CM | POA: Insufficient documentation

## 2020-08-21 DIAGNOSIS — Z01812 Encounter for preprocedural laboratory examination: Secondary | ICD-10-CM | POA: Insufficient documentation

## 2020-08-21 HISTORY — DX: Personal history of other complications of pregnancy, childbirth and the puerperium: Z87.59

## 2020-08-21 LAB — TYPE AND SCREEN
ABO/RH(D): B POS
Antibody Screen: NEGATIVE

## 2020-08-21 LAB — CBC
HCT: 39.9 % (ref 36.0–46.0)
Hemoglobin: 12.5 g/dL (ref 12.0–15.0)
MCH: 26.1 pg (ref 26.0–34.0)
MCHC: 31.3 g/dL (ref 30.0–36.0)
MCV: 83.3 fL (ref 80.0–100.0)
Platelets: 276 10*3/uL (ref 150–400)
RBC: 4.79 MIL/uL (ref 3.87–5.11)
RDW: 14.9 % (ref 11.5–15.5)
WBC: 9.8 10*3/uL (ref 4.0–10.5)
nRBC: 0 % (ref 0.0–0.2)

## 2020-08-21 LAB — SARS CORONAVIRUS 2 (TAT 6-24 HRS): SARS Coronavirus 2: NEGATIVE

## 2020-08-21 NOTE — Anesthesia Preprocedure Evaluation (Addendum)
Anesthesia Evaluation  Patient identified by MRN, date of birth, ID band Patient awake    Reviewed: Allergy & Precautions, NPO status , Patient's Chart, lab work & pertinent test results  Airway Mallampati: II  TM Distance: >3 FB Neck ROM: Full    Dental no notable dental hx. (+) Teeth Intact   Pulmonary neg pulmonary ROS,    Pulmonary exam normal breath sounds clear to auscultation       Cardiovascular hypertension, Normal cardiovascular exam Rhythm:Regular Rate:Normal  Hx/o Pre eclampsia   Neuro/Psych negative neurological ROS  negative psych ROS   GI/Hepatic Neg liver ROS, GERD  ,  Endo/Other  diabetes, Well Controlled, Gestational, Insulin Dependent  Renal/GU negative Renal ROS  negative genitourinary   Musculoskeletal negative musculoskeletal ROS (+)   Abdominal   Peds  Hematology negative hematology ROS (+)   Anesthesia Other Findings   Reproductive/Obstetrics (+) Pregnancy Previous C/Section                            Anesthesia Physical Anesthesia Plan  ASA: 2  Anesthesia Plan: Spinal   Post-op Pain Management:    Induction:   PONV Risk Score and Plan: 4 or greater and Treatment may vary due to age or medical condition and Scopolamine patch - Pre-op  Airway Management Planned: Natural Airway  Additional Equipment:   Intra-op Plan:   Post-operative Plan:   Informed Consent: I have reviewed the patients History and Physical, chart, labs and discussed the procedure including the risks, benefits and alternatives for the proposed anesthesia with the patient or authorized representative who has indicated his/her understanding and acceptance.     Dental advisory given  Plan Discussed with: CRNA and Anesthesiologist  Anesthesia Plan Comments:        Anesthesia Quick Evaluation

## 2020-08-22 ENCOUNTER — Inpatient Hospital Stay (HOSPITAL_COMMUNITY): Payer: 59 | Admitting: Anesthesiology

## 2020-08-22 ENCOUNTER — Encounter (HOSPITAL_COMMUNITY): Payer: Self-pay | Admitting: Obstetrics and Gynecology

## 2020-08-22 ENCOUNTER — Other Ambulatory Visit: Payer: Self-pay | Admitting: Obstetrics and Gynecology

## 2020-08-22 ENCOUNTER — Other Ambulatory Visit: Payer: Self-pay

## 2020-08-22 ENCOUNTER — Encounter (HOSPITAL_COMMUNITY)
Admission: RE | Disposition: A | Discharge status: Home / Self Care | Payer: Self-pay | Source: Home / Self Care | Attending: Obstetrics and Gynecology

## 2020-08-22 ENCOUNTER — Inpatient Hospital Stay (HOSPITAL_COMMUNITY)
Admission: RE | Admit: 2020-08-22 | Discharge: 2020-08-24 | DRG: 787 | Disposition: A | Discharge status: Home / Self Care | Payer: 59 | Attending: Obstetrics and Gynecology | Admitting: Obstetrics and Gynecology

## 2020-08-22 DIAGNOSIS — O24424 Gestational diabetes mellitus in childbirth, insulin controlled: Secondary | ICD-10-CM | POA: Diagnosis present

## 2020-08-22 DIAGNOSIS — Z3A39 39 weeks gestation of pregnancy: Secondary | ICD-10-CM | POA: Diagnosis not present

## 2020-08-22 DIAGNOSIS — D62 Acute posthemorrhagic anemia: Secondary | ICD-10-CM | POA: Diagnosis not present

## 2020-08-22 DIAGNOSIS — Z20822 Contact with and (suspected) exposure to covid-19: Secondary | ICD-10-CM | POA: Diagnosis present

## 2020-08-22 DIAGNOSIS — O9081 Anemia of the puerperium: Secondary | ICD-10-CM | POA: Diagnosis not present

## 2020-08-22 DIAGNOSIS — O34211 Maternal care for low transverse scar from previous cesarean delivery: Secondary | ICD-10-CM | POA: Diagnosis present

## 2020-08-22 DIAGNOSIS — Z98891 History of uterine scar from previous surgery: Secondary | ICD-10-CM

## 2020-08-22 DIAGNOSIS — O34219 Maternal care for unspecified type scar from previous cesarean delivery: Secondary | ICD-10-CM | POA: Diagnosis present

## 2020-08-22 LAB — GLUCOSE, CAPILLARY
Glucose-Capillary: 84 mg/dL (ref 70–99)
Glucose-Capillary: 90 mg/dL (ref 70–99)

## 2020-08-22 LAB — RPR: RPR Ser Ql: NONREACTIVE

## 2020-08-22 SURGERY — Surgical Case
Anesthesia: Spinal

## 2020-08-22 MED ORDER — BUPIVACAINE IN DEXTROSE 0.75-8.25 % IT SOLN
INTRATHECAL | Status: DC | PRN
  Administered 2020-08-22: 1.6 mL via INTRATHECAL

## 2020-08-22 MED ORDER — SCOPOLAMINE 1 MG/3DAYS TD PT72
MEDICATED_PATCH | TRANSDERMAL | Status: AC
  Filled 2020-08-22: qty 1

## 2020-08-22 MED ORDER — PSYLLIUM 0.52 G PO CAPS: 0.5200 g | ORAL_CAPSULE | Freq: Every day | ORAL | Status: DC | PRN

## 2020-08-22 MED ORDER — NALBUPHINE HCL 10 MG/ML IJ SOLN: 5.0000 mg | INTRAMUSCULAR | Status: DC | PRN

## 2020-08-22 MED ORDER — ONDANSETRON HCL 4 MG/2ML IJ SOLN
INTRAMUSCULAR | Status: DC | PRN
  Administered 2020-08-22: 4 mg via INTRAVENOUS

## 2020-08-22 MED ORDER — SIMETHICONE 80 MG PO CHEW
80.0000 mg | CHEWABLE_TABLET | Freq: Three times a day (TID) | ORAL | Status: DC
  Administered 2020-08-22 – 2020-08-24 (×6): 80 mg via ORAL
  Filled 2020-08-22 (×7): qty 1

## 2020-08-22 MED ORDER — OXYTOCIN-SODIUM CHLORIDE 30-0.9 UT/500ML-% IV SOLN
INTRAVENOUS | Status: DC | PRN
  Administered 2020-08-22: 30 [IU] via INTRAVENOUS

## 2020-08-22 MED ORDER — DIBUCAINE (PERIANAL) 1 % EX OINT: 1.0000 "application " | TOPICAL_OINTMENT | CUTANEOUS | Status: DC | PRN

## 2020-08-22 MED ORDER — FENTANYL CITRATE (PF) 100 MCG/2ML IJ SOLN: 25.0000 ug | INTRAMUSCULAR | Status: DC | PRN

## 2020-08-22 MED ORDER — COCONUT OIL OIL: 1.0000 "application " | TOPICAL_OIL | Status: DC | PRN

## 2020-08-22 MED ORDER — CEFAZOLIN SODIUM-DEXTROSE 2-4 GM/100ML-% IV SOLN
2.0000 g | INTRAVENOUS | Status: AC
  Administered 2020-08-22: 2 g via INTRAVENOUS

## 2020-08-22 MED ORDER — PHENYLEPHRINE HCL-NACL 20-0.9 MG/250ML-% IV SOLN
INTRAVENOUS | Status: AC
  Filled 2020-08-22: qty 250

## 2020-08-22 MED ORDER — DIPHENHYDRAMINE HCL 50 MG/ML IJ SOLN: 12.5000 mg | INTRAMUSCULAR | Status: DC | PRN

## 2020-08-22 MED ORDER — SOD CITRATE-CITRIC ACID 500-334 MG/5ML PO SOLN
30.0000 mL | ORAL | Status: AC
  Administered 2020-08-22: 30 mL via ORAL

## 2020-08-22 MED ORDER — MENTHOL 3 MG MT LOZG: 1.0000 | LOZENGE | OROMUCOSAL | Status: DC | PRN

## 2020-08-22 MED ORDER — ONDANSETRON HCL 4 MG/2ML IJ SOLN: 4.0000 mg | Freq: Three times a day (TID) | INTRAMUSCULAR | Status: DC | PRN

## 2020-08-22 MED ORDER — SODIUM CHLORIDE 0.9% FLUSH: 3.0000 mL | INTRAVENOUS | Status: DC | PRN

## 2020-08-22 MED ORDER — SENNOSIDES-DOCUSATE SODIUM 8.6-50 MG PO TABS: 1.0000 | ORAL_TABLET | Freq: Every day | ORAL | Status: DC | PRN

## 2020-08-22 MED ORDER — NALOXONE HCL 0.4 MG/ML IJ SOLN: 0.4000 mg | INTRAMUSCULAR | Status: DC | PRN

## 2020-08-22 MED ORDER — POVIDONE-IODINE 10 % EX SWAB
2.0000 "application " | Freq: Once | CUTANEOUS | Status: AC
  Administered 2020-08-22: 2 via TOPICAL

## 2020-08-22 MED ORDER — EPHEDRINE SULFATE-NACL 50-0.9 MG/10ML-% IV SOSY
PREFILLED_SYRINGE | INTRAVENOUS | Status: DC | PRN
  Administered 2020-08-22: 10 mg via INTRAVENOUS

## 2020-08-22 MED ORDER — LACTATED RINGERS IV SOLN: INTRAVENOUS | Status: DC

## 2020-08-22 MED ORDER — FENTANYL CITRATE (PF) 100 MCG/2ML IJ SOLN
INTRAMUSCULAR | Status: DC | PRN
  Administered 2020-08-22: 15 ug via INTRATHECAL

## 2020-08-22 MED ORDER — MEPERIDINE HCL 25 MG/ML IJ SOLN: 6.2500 mg | INTRAMUSCULAR | Status: DC | PRN

## 2020-08-22 MED ORDER — SOD CITRATE-CITRIC ACID 500-334 MG/5ML PO SOLN
ORAL | Status: AC
  Filled 2020-08-22: qty 30

## 2020-08-22 MED ORDER — NALBUPHINE HCL 10 MG/ML IJ SOLN: 5.0000 mg | Freq: Once | INTRAMUSCULAR | Status: AC | PRN

## 2020-08-22 MED ORDER — SIMETHICONE 80 MG PO CHEW: 80.0000 mg | CHEWABLE_TABLET | ORAL | Status: DC | PRN

## 2020-08-22 MED ORDER — ACETAMINOPHEN 500 MG PO TABS
1000.0000 mg | ORAL_TABLET | Freq: Four times a day (QID) | ORAL | Status: DC
  Administered 2020-08-22 – 2020-08-24 (×9): 1000 mg via ORAL
  Filled 2020-08-22 (×9): qty 2

## 2020-08-22 MED ORDER — PHENYLEPHRINE HCL-NACL 20-0.9 MG/250ML-% IV SOLN
INTRAVENOUS | Status: DC | PRN
  Administered 2020-08-22: 60 ug/min via INTRAVENOUS

## 2020-08-22 MED ORDER — DEXAMETHASONE SODIUM PHOSPHATE 10 MG/ML IJ SOLN
INTRAMUSCULAR | Status: AC
  Filled 2020-08-22: qty 1

## 2020-08-22 MED ORDER — NALOXONE HCL 4 MG/10ML IJ SOLN
1.0000 ug/kg/h | INTRAVENOUS | Status: DC | PRN
  Filled 2020-08-22: qty 5

## 2020-08-22 MED ORDER — ONDANSETRON HCL 4 MG/2ML IJ SOLN
INTRAMUSCULAR | Status: AC
  Filled 2020-08-22: qty 2

## 2020-08-22 MED ORDER — FENTANYL CITRATE (PF) 100 MCG/2ML IJ SOLN
INTRAMUSCULAR | Status: AC
  Filled 2020-08-22: qty 2

## 2020-08-22 MED ORDER — SODIUM CHLORIDE 0.9 % IV SOLN: INTRAVENOUS | Status: DC | PRN

## 2020-08-22 MED ORDER — ZOLPIDEM TARTRATE 5 MG PO TABS: 5.0000 mg | ORAL_TABLET | Freq: Every evening | ORAL | Status: DC | PRN

## 2020-08-22 MED ORDER — EPHEDRINE 5 MG/ML INJ
INTRAVENOUS | Status: AC
  Filled 2020-08-22: qty 10

## 2020-08-22 MED ORDER — WITCH HAZEL-GLYCERIN EX PADS: 1.0000 "application " | MEDICATED_PAD | CUTANEOUS | Status: DC | PRN

## 2020-08-22 MED ORDER — KETOROLAC TROMETHAMINE 30 MG/ML IJ SOLN: 30.0000 mg | Freq: Four times a day (QID) | INTRAMUSCULAR | Status: AC | PRN

## 2020-08-22 MED ORDER — NALBUPHINE HCL 10 MG/ML IJ SOLN
5.0000 mg | Freq: Once | INTRAMUSCULAR | Status: AC | PRN
  Administered 2020-08-22: 5 mg via INTRAVENOUS

## 2020-08-22 MED ORDER — IBUPROFEN 600 MG PO TABS
600.0000 mg | ORAL_TABLET | Freq: Four times a day (QID) | ORAL | Status: DC
  Administered 2020-08-22 – 2020-08-24 (×7): 600 mg via ORAL
  Filled 2020-08-22 (×7): qty 1

## 2020-08-22 MED ORDER — DIPHENHYDRAMINE HCL 25 MG PO CAPS: 25.0000 mg | ORAL_CAPSULE | ORAL | Status: DC | PRN

## 2020-08-22 MED ORDER — DEXAMETHASONE SODIUM PHOSPHATE 10 MG/ML IJ SOLN
INTRAMUSCULAR | Status: DC | PRN
  Administered 2020-08-22: 5 mg via INTRAVENOUS

## 2020-08-22 MED ORDER — SCOPOLAMINE 1 MG/3DAYS TD PT72
1.0000 | MEDICATED_PATCH | TRANSDERMAL | Status: DC
  Administered 2020-08-22: 1.5 mg via TRANSDERMAL

## 2020-08-22 MED ORDER — HYDROMORPHONE HCL 1 MG/ML IJ SOLN: 0.2000 mg | INTRAMUSCULAR | Status: DC | PRN

## 2020-08-22 MED ORDER — DIPHENHYDRAMINE HCL 25 MG PO CAPS: 25.0000 mg | ORAL_CAPSULE | Freq: Four times a day (QID) | ORAL | Status: DC | PRN

## 2020-08-22 MED ORDER — CEFAZOLIN SODIUM-DEXTROSE 2-4 GM/100ML-% IV SOLN
INTRAVENOUS | Status: AC
  Filled 2020-08-22: qty 100

## 2020-08-22 MED ORDER — SENNOSIDES-DOCUSATE SODIUM 8.6-50 MG PO TABS
2.0000 | ORAL_TABLET | Freq: Every day | ORAL | Status: DC
  Administered 2020-08-23 – 2020-08-24 (×2): 2 via ORAL
  Filled 2020-08-22 (×2): qty 2

## 2020-08-22 MED ORDER — PRENATAL MULTIVITAMIN CH
1.0000 | ORAL_TABLET | Freq: Every day | ORAL | Status: DC
  Administered 2020-08-22 – 2020-08-24 (×3): 1 via ORAL
  Filled 2020-08-22 (×3): qty 1

## 2020-08-22 MED ORDER — NALBUPHINE HCL 10 MG/ML IJ SOLN
5.0000 mg | INTRAMUSCULAR | Status: DC | PRN
  Filled 2020-08-22: qty 1

## 2020-08-22 MED ORDER — OXYTOCIN-SODIUM CHLORIDE 30-0.9 UT/500ML-% IV SOLN
2.5000 [IU]/h | INTRAVENOUS | Status: AC
  Administered 2020-08-22: 2.5 [IU]/h via INTRAVENOUS
  Filled 2020-08-22: qty 500

## 2020-08-22 MED ORDER — FERROUS SULFATE 325 (65 FE) MG PO TABS
325.0000 mg | ORAL_TABLET | Freq: Two times a day (BID) | ORAL | Status: DC
  Administered 2020-08-22 – 2020-08-24 (×4): 325 mg via ORAL
  Filled 2020-08-22 (×4): qty 1

## 2020-08-22 MED ORDER — KETOROLAC TROMETHAMINE 30 MG/ML IJ SOLN
30.0000 mg | Freq: Four times a day (QID) | INTRAMUSCULAR | Status: AC | PRN
  Administered 2020-08-22 (×2): 30 mg via INTRAVENOUS
  Filled 2020-08-22: qty 1

## 2020-08-22 MED ORDER — OXYCODONE HCL 5 MG PO TABS
5.0000 mg | ORAL_TABLET | ORAL | Status: DC | PRN
  Administered 2020-08-22 – 2020-08-23 (×2): 10 mg via ORAL
  Filled 2020-08-22 (×2): qty 2

## 2020-08-22 MED ORDER — MORPHINE SULFATE (PF) 0.5 MG/ML IJ SOLN
INTRAMUSCULAR | Status: DC | PRN
  Administered 2020-08-22: .15 mg via INTRATHECAL

## 2020-08-22 MED ORDER — METHYLERGONOVINE MALEATE 0.2 MG PO TABS: 0.2000 mg | ORAL_TABLET | ORAL | Status: DC | PRN

## 2020-08-22 MED ORDER — KETOROLAC TROMETHAMINE 30 MG/ML IJ SOLN
INTRAMUSCULAR | Status: AC
  Filled 2020-08-22: qty 1

## 2020-08-22 MED ORDER — MORPHINE SULFATE (PF) 0.5 MG/ML IJ SOLN
INTRAMUSCULAR | Status: AC
  Filled 2020-08-22: qty 10

## 2020-08-22 MED ORDER — METHYLERGONOVINE MALEATE 0.2 MG/ML IJ SOLN: 0.2000 mg | INTRAMUSCULAR | Status: DC | PRN

## 2020-08-22 SURGICAL SUPPLY — 37 items
APL SKNCLS STERI-STRIP NONHPOA (GAUZE/BANDAGES/DRESSINGS) ×1
BARRIER ADHS 3X4 INTERCEED (GAUZE/BANDAGES/DRESSINGS) ×2 IMPLANT
BENZOIN TINCTURE PRP APPL 2/3 (GAUZE/BANDAGES/DRESSINGS) ×2 IMPLANT
BRR ADH 4X3 ABS CNTRL BYND (GAUZE/BANDAGES/DRESSINGS) ×1
CHLORAPREP W/TINT 26ML (MISCELLANEOUS) ×2 IMPLANT
CLAMP CORD UMBIL (MISCELLANEOUS) IMPLANT
CLOTH BEACON ORANGE TIMEOUT ST (SAFETY) ×2 IMPLANT
DRSG OPSITE POSTOP 4X10 (GAUZE/BANDAGES/DRESSINGS) ×2 IMPLANT
ELECT REM PT RETURN 9FT ADLT (ELECTROSURGICAL) ×2
ELECTRODE REM PT RTRN 9FT ADLT (ELECTROSURGICAL) ×1 IMPLANT
EXTRACTOR VACUUM KIWI (MISCELLANEOUS) IMPLANT
GAUZE SPONGE 4X4 12PLY STRL (GAUZE/BANDAGES/DRESSINGS) ×4 IMPLANT
GLOVE BIOGEL M 7.0 STRL (GLOVE) ×4 IMPLANT
GLOVE BIOGEL PI IND STRL 7.0 (GLOVE) ×3 IMPLANT
GLOVE BIOGEL PI INDICATOR 7.0 (GLOVE) ×3
GOWN STRL REUS W/TWL LRG LVL3 (GOWN DISPOSABLE) ×6 IMPLANT
KIT ABG SYR 3ML LUER SLIP (SYRINGE) IMPLANT
NEEDLE HYPO 25X5/8 SAFETYGLIDE (NEEDLE) IMPLANT
NS IRRIG 1000ML POUR BTL (IV SOLUTION) ×2 IMPLANT
PACK C SECTION WH (CUSTOM PROCEDURE TRAY) ×2 IMPLANT
PAD ABD 8X7 1/2 STERILE (GAUZE/BANDAGES/DRESSINGS) ×2 IMPLANT
PAD OB MATERNITY 4.3X12.25 (PERSONAL CARE ITEMS) ×2 IMPLANT
PENCIL SMOKE EVAC W/HOLSTER (ELECTROSURGICAL) ×2 IMPLANT
RTRCTR C-SECT PINK 25CM LRG (MISCELLANEOUS) IMPLANT
STRIP CLOSURE SKIN 1/2X4 (GAUZE/BANDAGES/DRESSINGS) ×2 IMPLANT
SUT PDS AB 0 CT1 27 (SUTURE) ×4 IMPLANT
SUT PLAIN 0 NONE (SUTURE) IMPLANT
SUT VIC AB 0 CTX 36 (SUTURE) ×6
SUT VIC AB 0 CTX36XBRD ANBCTRL (SUTURE) ×3 IMPLANT
SUT VIC AB 2-0 CT1 27 (SUTURE) ×2
SUT VIC AB 2-0 CT1 TAPERPNT 27 (SUTURE) ×1 IMPLANT
SUT VIC AB 3-0 SH 27 (SUTURE)
SUT VIC AB 3-0 SH 27X BRD (SUTURE) IMPLANT
SUT VIC AB 4-0 KS 27 (SUTURE) ×2 IMPLANT
TOWEL OR 17X24 6PK STRL BLUE (TOWEL DISPOSABLE) ×2 IMPLANT
TRAY FOLEY W/BAG SLVR 14FR LF (SET/KITS/TRAYS/PACK) ×2 IMPLANT
WATER STERILE IRR 1000ML POUR (IV SOLUTION) ×2 IMPLANT

## 2020-08-22 NOTE — H&P (Deleted)
  The note originally documented on this encounter has been moved the the encounter in which it belongs.  

## 2020-08-22 NOTE — Transfer of Care (Signed)
Immediate Anesthesia Transfer of Care Note  Patient: Julie Fleming  Procedure(s) Performed: CESAREAN SECTION  Patient Location: PACU  Anesthesia Type:Spinal  Level of Consciousness: awake, alert  and oriented  Airway & Oxygen Therapy: Patient Spontanous Breathing  Post-op Assessment: Report given to RN and Post -op Vital signs reviewed and stable  Post vital signs: Reviewed and stable  Last Vitals:  Vitals Value Taken Time  BP 96/61 08/22/20 0909  Temp    Pulse 64 08/22/20 0910  Resp 19 08/22/20 0910  SpO2 96 % 08/22/20 0910  Vitals shown include unvalidated device data.  Last Pain:  Vitals:   08/22/20 0600  TempSrc: Oral         Complications: No notable events documented.

## 2020-08-22 NOTE — Op Note (Signed)
Cesarean Section Procedure Note  Indications:  39 weeks and 1 day with h/o cesarean section pt desires repeat and declines TOL  Pre-operative Diagnosis: 39 week 1 day pregnancy.  Post-operative Diagnosis: same  Surgeon: Christophe Louis M.D.  Assistants: Drema Dallas M.D.  Anesthesia: Spinal anesthesia  ASA Class: 2   Procedure Details   The patient was seen in the Holding Room. The risks, benefits, complications, treatment options, and expected outcomes were discussed with the patient.  The patient concurred with the proposed plan, giving informed consent.  The site of surgery properly noted/marked. The patient was taken to Operating Room # C, identified as Tawny Brumm and the procedure verified as C-Section Delivery. A Time Out was held and the above information confirmed.  After induction of anesthesia, the patient was draped and prepped in the usual sterile manner. A Pfannenstiel incision was made and carried down through the subcutaneous tissue to the fascia. Fascial incision was made and extended transversely. The fascia was separated from the underlying rectus tissue superiorly and inferiorly. The peritoneum was identified and entered. Peritoneal incision was extended longitudinally. The utero-vesical peritoneal reflection was incised transversely and the bladder flap was bluntly freed from the lower uterine segment. A low transverse uterine incision was made. Delivered from cephalic presentation was a  Female with Apgar scores of 9 at one minute and 9 at five minutes. After the umbilical cord was clamped and cut cord blood was obtained for evaluation. The placenta was removed intact and appeared normal. The uterine outline, tubes and ovaries appeared normal. The uterine incision was closed with running locked sutures of  0 monocryl. The incision was reinforced with a second layer of 0 vicryl.  . Hemostasis was observed. Lavage was carried out until clear. The fascia was then  reapproximated with running sutures of  0 pds  . The skin was reapproximated with  4-0 vicryl .  Instrument, sponge, and needle counts were correct prior the abdominal closure and at the conclusion of the case.   Findings: Female infant in cephalic presentation. Normal appearing fallopian tubes and ovaries.. clear amniotic fluid   Estimated Blood Loss:   650 mL          Drains: None         Total IV Fluids:  per anesthesia ml         Specimens: Placenta to labor and delivery           Implants: None         Complications:  None; patient tolerated the procedure well.         Disposition: PACU - hemodynamically stable.         Condition: stable  Attending Attestation: I performed the procedure.

## 2020-08-22 NOTE — H&P (Signed)
Date of Initial H&P: 08/22/2020  History reviewed, patient examined, no change in status, stable for surgery. 08/22/2020

## 2020-08-22 NOTE — H&P (Signed)
Julie Fleming is a 35 y.o. female G3P1011 at 64 weeks and 1 day presents for repeat cesarean section. Pregnancy complicated by cesarean section and gestational diabetes A2. Prenatal care provided by Dr.Nayanna Seaborn Landry Mellow with [facility].    OB History     Gravida  3   Para  1   Term  1   Preterm      AB  1   Living  1      SAB  1   IAB      Ectopic      Multiple      Live Births  1          Past Medical History:  Diagnosis Date   Diabetes mellitus without complication ([place])    Gestational diabetes    History of pre-eclampsia    Past Surgical History:  Procedure Laterality Date   CESAREAN SECTION N/A 12/30/2017   Procedure: CESAREAN SECTION;  Surgeon: Thurnell Lose, MD;  Location: [place];  Service: Obstetrics;  Laterality: N/A;   NO PAST SURGERIES     Family History: family history includes Diabetes in her mother; Hypertension in her mother. Social History:  reports that she has never smoked. She has never used smokeless tobacco. She reports that she does not drink alcohol and does not use drugs.     Maternal Diabetes: Yes:  Diabetes Type:  Insulin/Medication controlled Genetic Screening: Normal Maternal Ultrasounds/Referrals: Normal Fetal Ultrasounds or other Referrals:  None Maternal Substance Abuse:  No Significant Maternal Medications:  Meds include: Other:  Significant Maternal Lab Results:  Group B Strep negative Other Comments:  None  Review of Systems  Constitutional: Negative.   HENT: Negative.    Eyes: Negative.   Respiratory: Negative.    Cardiovascular: Negative.   Gastrointestinal: Negative.   Endocrine: Negative.   Genitourinary: Negative.   Musculoskeletal: Negative.   Skin: Negative.   Allergic/Immunologic: Negative.   Neurological: Negative.   Hematological: Negative.   Psychiatric/Behavioral: Negative.    History   Last menstrual period 11/23/2019, currently breastfeeding. Maternal Exam:  Introitus: Normal  vulva.  Physical Exam Constitutional:      Appearance: Normal appearance.  HENT:     Head: Normocephalic.  Eyes:     Pupils: Pupils are equal, round, and reactive to light.  Cardiovascular:     Rate and Rhythm: Normal rate and regular rhythm.     Pulses: Normal pulses.     Heart sounds: Normal heart sounds.  Pulmonary:     Effort: Pulmonary effort is normal.     Breath sounds: Normal breath sounds.  Abdominal:     Tenderness: There is no abdominal tenderness.  Genitourinary:    General: Normal vulva.  Musculoskeletal:        General: No swelling. Normal range of motion.     Cervical back: Normal range of motion and neck supple.  Skin:    General: Skin is warm and dry.  Neurological:     General: No focal deficit present.     Mental Status: She is alert and oriented to person, place, and time.  Psychiatric:        Mood and Affect: Mood normal.        Behavior: Behavior normal.    Prenatal labs: ABO, Rh: --/--/B POS (07/11 8413) Antibody: NEG (07/11 0921) Rubella: Immune (12/02 0000) RPR: Nonreactive (12/02 0000)  HBsAg: Negative (12/02 0000)  HIV: Non-reactive (12/02 0000)  GBS:   Negative   Assessment/Plan 39 weeks and 1  day with h/o cesarean section pt desirese repeat cesarean section and declines trial of labor.  R/B/A of cesarean section discussed with the patient including but not limited to infection, bleeding damage to bowel bladder and baby with the need for further surgery. R/O transfusion HIV/ Hep B&C discussed. Pt voiced understanding and desires to proceed with cesarean section.    - A2 DM - Insulin controlled. Will discontinue after delivery. Check fasting cbg.   Christophe Louis 08/22/2020, 12:40 AM

## 2020-08-22 NOTE — Anesthesia Procedure Notes (Signed)
Spinal  Patient location during procedure: OR Start time: 08/22/2020 7:45 AM End time: 08/22/2020 7:50 AM Reason for block: surgical anesthesia Staffing Performed: anesthesiologist  Anesthesiologist: Josephine Igo, MD Preanesthetic Checklist Completed: patient identified, IV checked, site marked, risks and benefits discussed, surgical consent, monitors and equipment checked, pre-op evaluation and timeout performed Spinal Block Patient position: sitting Prep: DuraPrep and site prepped and draped Patient monitoring: heart rate, cardiac monitor, continuous pulse ox and blood pressure Approach: midline Location: L3-4 Injection technique: single-shot Needle Needle type: Sprotte and Pencan  Needle gauge: 24 G Needle length: 9 cm Needle insertion depth: 5 cm Assessment Sensory level: T4 Events: paresthesia and CSF return Additional Notes Patient tolerated procedure well. Adequate sensory level. Right leg transient paresthesia which resolved with needle redirection.

## 2020-08-22 NOTE — Anesthesia Postprocedure Evaluation (Signed)
Anesthesia Post Note  Patient: Julie Fleming  Procedure(s) Performed: CESAREAN SECTION     Patient location during evaluation: PACU Anesthesia Type: Spinal Level of consciousness: oriented and awake and alert Pain management: pain level controlled Vital Signs Assessment: post-procedure vital signs reviewed and stable Respiratory status: spontaneous breathing, respiratory function stable and nonlabored ventilation Cardiovascular status: blood pressure returned to baseline and stable Postop Assessment: no headache, no backache, no apparent nausea or vomiting, spinal receding and patient able to bend at knees Anesthetic complications: no   No notable events documented.  Last Vitals:  Vitals:   08/22/20 1000 08/22/20 1013  BP: 122/72 117/69  Pulse:  79  Resp: (!) 21   Temp:  36.5 C  SpO2:  100%    Last Pain:  Vitals:   08/22/20 1013  TempSrc: Oral  PainSc:    Pain Goal:    LLE Motor Response: Purposeful movement (08/22/20 1000)   RLE Motor Response: Purposeful movement (08/22/20 1000)       Epidural/Spinal Function Cutaneous sensation: Pins and Needles (08/22/20 1000), Patient able to flex knees: No (08/22/20 1000), Patient able to lift hips off bed: No (08/22/20 1000), Back pain beyond tenderness at insertion site: No (08/22/20 1000), Progressively worsening motor and/or sensory loss: No (08/22/20 1000), Bowel and/or bladder incontinence post epidural: No (08/22/20 1000)  Nydia Ytuarte A.

## 2020-08-23 LAB — CBC
HCT: 28.3 % — ABNORMAL LOW (ref 36.0–46.0)
Hemoglobin: 9.1 g/dL — ABNORMAL LOW (ref 12.0–15.0)
MCH: 26.5 pg (ref 26.0–34.0)
MCHC: 32.2 g/dL (ref 30.0–36.0)
MCV: 82.3 fL (ref 80.0–100.0)
Platelets: 213 10*3/uL (ref 150–400)
RBC: 3.44 MIL/uL — ABNORMAL LOW (ref 3.87–5.11)
RDW: 14.9 % (ref 11.5–15.5)
WBC: 10.2 10*3/uL (ref 4.0–10.5)
nRBC: 0 % (ref 0.0–0.2)

## 2020-08-23 LAB — GLUCOSE, CAPILLARY: Glucose-Capillary: 86 mg/dL (ref 70–99)

## 2020-08-23 MED ORDER — LACTATED RINGERS IV BOLUS: 500.0000 mL | Freq: Once | INTRAVENOUS | Status: AC

## 2020-08-23 MED ORDER — LACTATED RINGERS IV SOLN: INTRAVENOUS | Status: AC

## 2020-08-23 NOTE — Progress Notes (Signed)
Subjective: Postpartum Day 1: Cesarean Delivery Patient reports feeling dizzy when ambulating. She  did not experience this after her last cesarean section. Pain is moderate.. Bleeding is decreased. She does not desires circumcision of newborn.     Objective: Vital signs in last 24 hours: Temp:  [97.2 F (36.2 C)-98.4 F (36.9 C)] 98 F (36.7 C) (07/13 0902) Pulse Rate:  [66-74] 69 (07/13 0902) Resp:  [18] 18 (07/13 0902) BP: (81-101)/(50-60) 89/50 (07/13 0902) SpO2:  [99 %-100 %] 100 % (07/13 0902)  Physical Exam:  General: alert, cooperative, and no distress Lochia: appropriate Uterine Fundus: firm Incision: bandage is clean dry and intact  DVT Evaluation: No evidence of DVT seen on physical exam.  Recent Labs    08/21/20 1000 08/23/20 0540  HGB 12.5 9.1*  HCT 39.9 28.3*    Assessment/Plan: Status post Cesarean section. Postoperative course complicated by dizziness   - IV fluid bolus 500 cc and then 125 cc to complete 1 liter of LR - pt encouraged to increase po intact  - acute blood loss anemia- ferrous sulfate po  Once dizziness subsides recommend ambulation tid.  - GU normal voiding without difficulty  - GI no bowel function yet  - Routine postpartum care   Julie Fleming 08/23/2020, 1:45 PM

## 2020-08-23 NOTE — Progress Notes (Signed)
IV fluids complete, drinking po fluids well, ate a meal brought form home, napped and reports she has walked in the room and gone to bathroom twice without dizziness. Denies any dizziness at this time.

## 2020-08-24 LAB — CBC WITH DIFFERENTIAL/PLATELET
Abs Immature Granulocytes: 0.08 10*3/uL — ABNORMAL HIGH (ref 0.00–0.07)
Basophils Absolute: 0 10*3/uL (ref 0.0–0.1)
Basophils Relative: 0 %
Eosinophils Absolute: 0.2 10*3/uL (ref 0.0–0.5)
Eosinophils Relative: 2 %
HCT: 31 % — ABNORMAL LOW (ref 36.0–46.0)
Hemoglobin: 10 g/dL — ABNORMAL LOW (ref 12.0–15.0)
Immature Granulocytes: 1 %
Lymphocytes Relative: 27 %
Lymphs Abs: 2.5 10*3/uL (ref 0.7–4.0)
MCH: 26.7 pg (ref 26.0–34.0)
MCHC: 32.3 g/dL (ref 30.0–36.0)
MCV: 82.7 fL (ref 80.0–100.0)
Monocytes Absolute: 0.4 10*3/uL (ref 0.1–1.0)
Monocytes Relative: 5 %
Neutro Abs: 6.3 10*3/uL (ref 1.7–7.7)
Neutrophils Relative %: 65 %
Platelets: 228 10*3/uL (ref 150–400)
RBC: 3.75 MIL/uL — ABNORMAL LOW (ref 3.87–5.11)
RDW: 15.1 % (ref 11.5–15.5)
WBC: 9.5 10*3/uL (ref 4.0–10.5)
nRBC: 0 % (ref 0.0–0.2)

## 2020-08-24 MED ORDER — OXYCODONE HCL 5 MG PO TABS: 5.0000 mg | ORAL_TABLET | ORAL | 0 refills | Status: AC | PRN

## 2020-08-24 MED ORDER — ACETAMINOPHEN 500 MG PO TABS: 1000.0000 mg | ORAL_TABLET | Freq: Three times a day (TID) | ORAL | 0 refills | Status: AC | PRN

## 2020-08-24 MED ORDER — IBUPROFEN 600 MG PO TABS: 600.0000 mg | ORAL_TABLET | Freq: Four times a day (QID) | ORAL | 1 refills | Status: DC | PRN

## 2020-08-24 NOTE — Discharge Summary (Signed)
Postpartum Discharge Summary  Date of Service updated 08/24/2020     Patient Name: Julie Fleming DOB: March 01, 1985 MRN: [id number]  Date of admission: 08/22/2020 Delivery date:08/22/2020  Delivering provider: Christophe Louis  Date of discharge: 08/24/2020  Admitting diagnosis: H/O cesarean section complicating pregnancy [K74.259] Intrauterine pregnancy: [redacted]w[redacted]d    Secondary diagnosis:  Active Problems:   H/O cesarean section complicating pregnancy  Additional problems: None    Discharge diagnosis: Term Pregnancy Delivered and GDM A2                                              Post partum procedures: None Augmentation: N/A Complications: None  Hospital course: Sceduled C/S   35y.o. yo [id number] admitted to the hospital 08/22/2020 for scheduled cesarean section with the following indication:Elective Repeat.Delivery details are as follows:  Membrane Rupture Time/Date: 8:14 AM ,08/22/2020   Delivery Method:C-Section, Low Transverse  Details of operation can be found in separate operative note.  Patient had an uncomplicated postpartum course.  She is ambulating, tolerating a regular diet, passing flatus, and urinating well. Patient is discharged home in stable condition on  08/24/20        Newborn Data: Birth date:08/22/2020  Birth time:8:15 AM  Gender:Female  Living status:Living  Apgars:9 ,9  Weight:3455 g     Magnesium Sulfate received: No BMZ received: No Rhophylac:N/A MMR:N/A T-DaP:Given prenatally Flu: N/A Transfusion:No  Physical exam  Vitals:   08/23/20 0605 08/23/20 0902 08/23/20 1545 08/24/20 0524  BP: (!) 81/50 (!) 89/50 (!) 102/52 102/70  Pulse: 74 69 82 60  Resp: 18 18 17 18   Temp: 98.4 F (36.9 C) 98 F (36.7 C) 97.6 F (36.4 C) 98.1 F (36.7 C)  TempSrc: Oral Oral Oral Oral  SpO2: 100% 100% 100% 100%  Weight:      Height:       General: alert, cooperative, and no distress Lochia: appropriate Uterine Fundus: firm Incision: N/A DVT  Evaluation: No evidence of DVT seen on physical exam. Labs: Lab Results  Component Value Date   WBC 9.5 08/24/2020   HGB 10.0 (L) 08/24/2020   HCT 31.0 (L) 08/24/2020   MCV 82.7 08/24/2020   PLT 228 08/24/2020   CMP Latest Ref Rng & Units 04/11/2018  Glucose 70 - 99 mg/dL 95  BUN 6 - 20 mg/dL 9  Creatinine 0.44 - 1.00 mg/dL 0.45  Sodium 135 - 145 mmol/L 141  Potassium 3.5 - 5.1 mmol/L 3.6  Chloride 98 - 111 mmol/L 111  CO2 22 - 32 mmol/L 22  Calcium 8.9 - 10.3 mg/dL 8.5(L)  Total Protein 6.5 - 8.1 g/dL -  Total Bilirubin 0.3 - 1.2 mg/dL -  Alkaline Phos 38 - 126 U/L -  AST 15 - 41 U/L -  ALT 0 - 44 U/L -   Edinburgh Score: Edinburgh Postnatal Depression Scale Screening Tool 08/24/2020  I have been able to laugh and see the funny side of things. 0  I have looked forward with enjoyment to things. 0  I have blamed myself unnecessarily when things went wrong. 0  I have been anxious or worried for no good reason. 0  I have felt scared or panicky for no good reason. 0  Things have been getting on top of me. 0  I have been so unhappy that I have had difficulty  sleeping. 0  I have felt sad or miserable. 0  I have been so unhappy that I have been crying. 0  The thought of harming myself has occurred to me. 0  Edinburgh Postnatal Depression Scale Total 0      After visit meds:  Allergies as of 08/24/2020   No Known Allergies      Medication List     STOP taking these medications    aspirin EC 81 MG tablet   HumuLIN N 100 UNIT/ML injection Generic drug: insulin NPH Human   HumuLIN R 100 units/mL injection Generic drug: insulin regular       TAKE these medications    acetaminophen 500 MG tablet Commonly known as: TYLENOL Take 2 tablets (1,000 mg total) by mouth every 8 (eight) hours as needed for mild pain or moderate pain.   ibuprofen 600 MG tablet Commonly known as: ADVIL Take 1 tablet (600 mg total) by mouth every 6 (six) hours as needed.   oxyCODONE 5 MG  immediate release tablet Commonly known as: Oxy IR/ROXICODONE Take 1-2 tablets (5-10 mg total) by mouth every 4 (four) hours as needed for up to 7 days for moderate pain.   psyllium 0.52 g capsule Commonly known as: REGULOID Take 0.52 g by mouth daily as needed (Constipation).   sennosides-docusate sodium 8.6-50 MG tablet Commonly known as: SENOKOT-S Take 1 tablet by mouth daily as needed for constipation.         Discharge home in stable condition Infant Feeding: Breast Infant Disposition:home with mother Discharge instruction: per After Visit Summary and Postpartum booklet. Activity: Advance as tolerated. Pelvic rest for 6 weeks.  Diet: routine diet Anticipated Birth Control: Unsure Postpartum Appointment:2 weeks Additional Postpartum F/U: Incision check 2 weeks  Future Appointments:No future appointments. Follow up Visit:  Follow-up Information     Christophe Louis, MD. Go in 2 week(s).   Specialty: Obstetrics and Gynecology Why: keep your appointmetn with Dr. Landry Mellow in 2 weeks for an incision check Contact information: [address] [place] [address] [phone]                     08/24/2020 Christophe Louis, MD

## 2020-08-24 NOTE — Discharge Instructions (Signed)
ces

## 2020-08-26 ENCOUNTER — Inpatient Hospital Stay (HOSPITAL_COMMUNITY)
Admission: AD | Admit: 2020-08-26 | Discharge: 2020-08-26 | Disposition: A | Discharge status: Home / Self Care | Payer: 59 | Attending: Obstetrics and Gynecology | Admitting: Obstetrics and Gynecology

## 2020-08-26 ENCOUNTER — Other Ambulatory Visit: Payer: Self-pay

## 2020-08-26 DIAGNOSIS — O9089 Other complications of the puerperium, not elsewhere classified: Secondary | ICD-10-CM | POA: Insufficient documentation

## 2020-08-26 DIAGNOSIS — Z9109 Other allergy status, other than to drugs and biological substances: Secondary | ICD-10-CM | POA: Diagnosis not present

## 2020-08-26 DIAGNOSIS — O9973 Diseases of the skin and subcutaneous tissue complicating the puerperium: Secondary | ICD-10-CM | POA: Diagnosis not present

## 2020-08-26 DIAGNOSIS — L231 Allergic contact dermatitis due to adhesives: Secondary | ICD-10-CM | POA: Diagnosis not present

## 2020-08-26 DIAGNOSIS — L7682 Other postprocedural complications of skin and subcutaneous tissue: Secondary | ICD-10-CM | POA: Diagnosis present

## 2020-08-26 DIAGNOSIS — Z9889 Other specified postprocedural states: Secondary | ICD-10-CM | POA: Diagnosis not present

## 2020-08-26 DIAGNOSIS — R21 Rash and other nonspecific skin eruption: Secondary | ICD-10-CM | POA: Diagnosis not present

## 2020-08-26 MED ORDER — PREDNISONE 10 MG PO TABS: 40.0000 mg | ORAL_TABLET | Freq: Every day | ORAL | 0 refills | Status: AC

## 2020-08-26 MED ORDER — PREDNISONE 10 MG PO TABS: 40.0000 mg | ORAL_TABLET | Freq: Every day | ORAL | 0 refills | Status: DC

## 2020-08-26 MED ORDER — HYDROXYZINE PAMOATE 25 MG PO CAPS: 25.0000 mg | ORAL_CAPSULE | Freq: Three times a day (TID) | ORAL | 0 refills | Status: DC | PRN

## 2020-08-26 NOTE — Discharge Instructions (Signed)
-  Take prednisone 40mg  daily in the morning for 5 days. -You may take hydroxyzine 3 times daily as needed for itching. -Please follow-up in clinic early next week or sooner if concern of fever, worsening pain, or wound concerns.

## 2020-08-26 NOTE — MAU Provider Note (Signed)
History     CSN: [id number]  Arrival date and time: 08/26/20 1311   Event Date/Time   First Provider Initiated Contact with Patient 08/26/20 1331      Chief Complaint  Patient presents with   Blisters   HPI Julie Fleming is a 35 year old G2P2 now POD#4 s/p elective repeat Cesarean. Pt presents to MAU today given concern of post-op rash, first observed by patient today s/p removal of honeycomb dressing. Pt states that skin around incision has been itching and irritated but she did not have any other concerns prior to removing the dressing today. Pt did not apply any topical medications to rash or use any pain medications prior to presenting to MAU today. Pt denies any known allergies to adhesives s/p first Cesarean but recalls that a honeycomb dressing was not previously used. Of note, pt has history of gestational diabetes but no history of diabetes or other autoimmune conditions. No uterine tenderness. No reported fever or chills.   Past Medical History:  Diagnosis Date   Diabetes mellitus without complication ([place])    Gestational diabetes    History of pre-eclampsia     Past Surgical History:  Procedure Laterality Date   CESAREAN SECTION N/A 12/30/2017   Procedure: CESAREAN SECTION;  Surgeon: Thurnell Lose, MD;  Location: [place];  Service: Obstetrics;  Laterality: N/A;   CESAREAN SECTION N/A 08/22/2020   Procedure: CESAREAN SECTION;  Surgeon: Christophe Louis, MD;  Location: [place] [facility];  Service: Obstetrics;  Laterality: N/A;   NO PAST SURGERIES      Family History  Problem Relation Age of Onset   Diabetes Mother    Hypertension Mother     Social History   Tobacco Use   Smoking status: Never   Smokeless tobacco: Never  Vaping Use   Vaping Use: Never used  Substance Use Topics   Alcohol use: No   Drug use: No    Allergies:  Allergies  Allergen Reactions   Other Rash    Severe blistering reaction to honeycomb dressing s/p Cesarean    No medications  prior to admission.    Review of Systems  Constitutional:  Negative for chills and fever.  HENT:  Negative for congestion and sore throat.   Respiratory:  Negative for shortness of breath.   Cardiovascular:  Negative for chest pain.  Gastrointestinal:  Negative for abdominal pain.  Genitourinary:        Minimal lochia  Musculoskeletal:  Negative for myalgias.  Skin:  Positive for rash.       Itching and burning rash along abdominal incision.  Neurological:  Negative for headaches.  Physical Exam   Blood pressure 109/71, pulse (!) 108, temperature 98.2 F (36.8 C), temperature source Oral, resp. rate 20, height 5\' 3"  (1.6 m), weight 70.6 kg, SpO2 100 %, unknown if currently breastfeeding.  Physical Exam Gen: well appearing, alert and oriented, no acute distress HEENT: atraumatic, moist mucous membranes Resp: normal WOB Cardiac: normal rate Abd: see picture below. Area of raised erythema with discrete margins (in shape of honeycomb dressing previously removed) with diffuse secondary blistering. Abdominal incision intact, clean and dry. No visible drainage. Slight erythema along lower abdomen (superior to region of blistering) without overlying warmth. Skin pen used to delineate superior margin prior to discharge from MAU.     MAU Course   MDM Evaluated pt at bedside. Exam consistent with contact dermatitis. Dr. Nelda Marseille called with plan for po steroid burst with close follow-up in clinic with pt's  primary OBGYN early next week.  Assessment and Plan  Julie Fleming is a 35 year old G2P2 now POD#4 s/p elective repeat Cesarean. Pt presents to MAU today given concern of post-op rash, first observed today s/p removal of honeycomb dressing. Pt's presentation consistent with post-op contact dermatitis. No signs/symptoms of cellulitis, wound dehiscence or endometritis. - steroid burst (prednisone 40mg  daily in the morning x5 days) - hydroxyzine TID PRN for itching; continue tylenol,  ibuprofen and oxycodone for post-op pain - hypersensitivity to honeycomb dressing added to patient's problem list - close f/u in clinic with OBGYN provider (midwife on-call for Julie Fleming contacted)  Julie Ngo, MD OB Fellow, [facility] 08/26/2020 7:23 PM

## 2020-08-26 NOTE — MAU Note (Signed)
Presents stating she's s/p Cesarean section on 08/22/2020.  Removed Honeycomb dressing today and noticed blisters @  incision site.  Denies bleeding or drainage from incision site.

## 2020-08-28 ENCOUNTER — Encounter (HOSPITAL_COMMUNITY): Payer: Self-pay | Admitting: Obstetrics and Gynecology

## 2020-08-28 ENCOUNTER — Inpatient Hospital Stay (HOSPITAL_COMMUNITY)
Admission: AD | Admit: 2020-08-28 | Discharge: 2020-08-28 | Disposition: A | Discharge status: Home / Self Care | Payer: 59 | Attending: Obstetrics and Gynecology | Admitting: Obstetrics and Gynecology

## 2020-08-28 DIAGNOSIS — Z791 Long term (current) use of non-steroidal anti-inflammatories (NSAID): Secondary | ICD-10-CM | POA: Diagnosis not present

## 2020-08-28 DIAGNOSIS — Z8759 Personal history of other complications of pregnancy, childbirth and the puerperium: Secondary | ICD-10-CM | POA: Diagnosis not present

## 2020-08-28 DIAGNOSIS — O135 Gestational [pregnancy-induced] hypertension without significant proteinuria, complicating the puerperium: Secondary | ICD-10-CM | POA: Diagnosis not present

## 2020-08-28 DIAGNOSIS — Z7952 Long term (current) use of systemic steroids: Secondary | ICD-10-CM | POA: Diagnosis not present

## 2020-08-28 DIAGNOSIS — Z8249 Family history of ischemic heart disease and other diseases of the circulatory system: Secondary | ICD-10-CM | POA: Diagnosis not present

## 2020-08-28 DIAGNOSIS — M7989 Other specified soft tissue disorders: Secondary | ICD-10-CM | POA: Insufficient documentation

## 2020-08-28 DIAGNOSIS — Z98891 History of uterine scar from previous surgery: Secondary | ICD-10-CM | POA: Diagnosis not present

## 2020-08-28 DIAGNOSIS — R519 Headache, unspecified: Secondary | ICD-10-CM | POA: Diagnosis not present

## 2020-08-28 DIAGNOSIS — O139 Gestational [pregnancy-induced] hypertension without significant proteinuria, unspecified trimester: Secondary | ICD-10-CM

## 2020-08-28 LAB — CBC
HCT: 32.4 % — ABNORMAL LOW (ref 36.0–46.0)
Hemoglobin: 10.5 g/dL — ABNORMAL LOW (ref 12.0–15.0)
MCH: 26.4 pg (ref 26.0–34.0)
MCHC: 32.4 g/dL (ref 30.0–36.0)
MCV: 81.6 fL (ref 80.0–100.0)
Platelets: 337 10*3/uL (ref 150–400)
RBC: 3.97 MIL/uL (ref 3.87–5.11)
RDW: 14.6 % (ref 11.5–15.5)
WBC: 8.8 10*3/uL (ref 4.0–10.5)
nRBC: 0 % (ref 0.0–0.2)

## 2020-08-28 LAB — COMPREHENSIVE METABOLIC PANEL
ALT: 23 U/L (ref 0–44)
AST: 23 U/L (ref 15–41)
Albumin: 2.6 g/dL — ABNORMAL LOW (ref 3.5–5.0)
Alkaline Phosphatase: 102 U/L (ref 38–126)
Anion gap: 8 (ref 5–15)
BUN: 9 mg/dL (ref 6–20)
CO2: 22 mmol/L (ref 22–32)
Calcium: 8.5 mg/dL — ABNORMAL LOW (ref 8.9–10.3)
Chloride: 109 mmol/L (ref 98–111)
Creatinine, Ser: 0.38 mg/dL — ABNORMAL LOW (ref 0.44–1.00)
GFR, Estimated: >60 mL/min (ref >60)
Glucose, Bld: 100 mg/dL — ABNORMAL HIGH (ref 70–99)
Potassium: 3.1 mmol/L — ABNORMAL LOW (ref 3.5–5.1)
Sodium: 139 mmol/L (ref 135–145)
Total Bilirubin: 0.5 mg/dL (ref 0.3–1.2)
Total Protein: 5.8 g/dL — ABNORMAL LOW (ref 6.5–8.1)

## 2020-08-28 LAB — PROTEIN / CREATININE RATIO, URINE
Creatinine, Urine: 23.77 mg/dL
Total Protein, Urine: <6 mg/dL

## 2020-08-28 MED ORDER — NIFEDIPINE ER OSMOTIC RELEASE 30 MG PO TB24: 30.0000 mg | ORAL_TABLET | Freq: Every day | ORAL | 2 refills | Status: DC

## 2020-08-28 MED ORDER — CYCLOBENZAPRINE HCL 5 MG PO TABS
10.0000 mg | ORAL_TABLET | Freq: Once | ORAL | Status: AC
  Administered 2020-08-28: 10 mg via ORAL
  Filled 2020-08-28: qty 2

## 2020-08-28 NOTE — MAU Note (Signed)
Noted feet are swollen this morning.  +HA, checked BP and it was elevated.  Denies BP problems with present pregnancy.  HX of PP pre-eclampsia with first.

## 2020-08-28 NOTE — MAU Provider Note (Signed)
History     CSN: [id number]  Arrival date and time: 08/28/20 1623   Event Date/Time   First Provider Initiated Contact with Patient 08/28/20 1718      Chief Complaint  Patient presents with   Headache   Hypertension   Foot Swelling   HPI This is a 35 year old G3 P2-0-1-2 who is approximately 6 days postpartum from a cesarean section.  Her pregnancy was complicated by history of postpartum preeclampsia and diabetes without complication.  The patient presents with headache that started earlier today.  She checked her blood pressure and it was elevated 150s over 90s.  This is abnormal for her.  She is currently on prednisone, ibuprofen, oxycodone.  She took the prednisone and ibuprofen this morning without improvement of her headache.  Her headache is 5 out of 10, dull.  No palliating or provoking factors.  She came to the hospital for evaluation.  She denies vision changes, excessive bleeding, pain.  OB History     Gravida  3   Para  2   Term  2   Preterm      AB  1   Living  2      SAB  1   IAB      Ectopic      Multiple  0   Live Births  2           Past Medical History:  Diagnosis Date   Diabetes mellitus without complication ([place])    Gestational diabetes    History of pre-eclampsia     Past Surgical History:  Procedure Laterality Date   CESAREAN SECTION N/A 12/30/2017   Procedure: CESAREAN SECTION;  Surgeon: Thurnell Lose, MD;  Location: [facility];  Service: Obstetrics;  Laterality: N/A;   CESAREAN SECTION N/A 08/22/2020   Procedure: CESAREAN SECTION;  Surgeon: Christophe Louis, MD;  Location: [facility];  Service: Obstetrics;  Laterality: N/A;   NO PAST SURGERIES      Family History  Problem Relation Age of Onset   Diabetes Mother    Hypertension Mother    Healthy Father     Social History   Tobacco Use   Smoking status: Never   Smokeless tobacco: Never  Vaping Use   Vaping Use: Never used  Substance Use Topics   Alcohol use: No    Drug use: No    Allergies:  Allergies  Allergen Reactions   Other Rash    Severe blistering reaction to honeycomb dressing s/p Cesarean    Medications Prior to Admission  Medication Sig Dispense Refill Last Dose   acetaminophen (TYLENOL) 500 MG tablet Take 2 tablets (1,000 mg total) by mouth every 8 (eight) hours as needed for mild pain or moderate pain. 30 tablet 0 08/27/2020   hydrOXYzine (VISTARIL) 25 MG capsule Take 1 capsule (25 mg total) by mouth 3 (three) times daily as needed for itching. 20 capsule 0 08/28/2020   ibuprofen (ADVIL) 600 MG tablet Take 1 tablet (600 mg total) by mouth every 6 (six) hours as needed. 30 tablet 1 08/28/2020 at 1400   oxyCODONE (OXY IR/ROXICODONE) 5 MG immediate release tablet Take 1-2 tablets (5-10 mg total) by mouth every 4 (four) hours as needed for up to 7 days for moderate pain. 20 tablet 0 Past Week   predniSONE (DELTASONE) 10 MG tablet Take 4 tablets (40 mg total) by mouth daily for 5 days. 20 tablet 0 08/28/2020 at 1400   Prenatal Vit-Fe Fumarate-FA (MULTIVITAMIN-PRENATAL) 27-0.8 MG TABS  tablet Take 1 tablet by mouth daily at 12 noon.   08/28/2020   psyllium (REGULOID) 0.52 g capsule Take 0.52 g by mouth daily as needed (Constipation).   Past Week   sennosides-docusate sodium (SENOKOT-S) 8.6-50 MG tablet Take 1 tablet by mouth daily as needed for constipation.   Past Week    Review of Systems  All other systems reviewed and are negative. Physical Exam   Blood pressure 124/74, pulse (!) 50, temperature 99.7 F (37.6 C), resp. rate 18, SpO2 100 %, currently breastfeeding.  Physical Exam Vitals and nursing note reviewed.  Constitutional:      Appearance: She is well-developed.  HENT:     Head: Normocephalic and atraumatic.  Cardiovascular:     Rate and Rhythm: Normal rate and regular rhythm.  Pulmonary:     Effort: Pulmonary effort is normal.     Breath sounds: Normal breath sounds.  Abdominal:     General: Bowel sounds are normal.      Palpations: Abdomen is soft.  Skin:    General: Skin is warm and dry.  Neurological:     Mental Status: She is alert.  Psychiatric:        Mood and Affect: Mood normal.        Behavior: Behavior normal.   Results for orders placed or performed during the hospital encounter of 08/28/20 (from the past 24 hour(s))  CBC     Status: Abnormal   Collection Time: 08/28/20  4:48 PM  Result Value Ref Range   WBC 8.8 4.0 - 10.5 K/uL   RBC 3.97 3.87 - 5.11 MIL/uL   Hemoglobin 10.5 (L) 12.0 - 15.0 g/dL   HCT 32.4 (L) 36.0 - 46.0 %   MCV 81.6 80.0 - 100.0 fL   MCH 26.4 26.0 - 34.0 pg   MCHC 32.4 30.0 - 36.0 g/dL   RDW 14.6 11.5 - 15.5 %   Platelets 337 150 - 400 K/uL   nRBC 0.0 0.0 - 0.2 %  Comprehensive metabolic panel     Status: Abnormal   Collection Time: 08/28/20  4:48 PM  Result Value Ref Range   Sodium 139 135 - 145 mmol/L   Potassium 3.1 (L) 3.5 - 5.1 mmol/L   Chloride 109 98 - 111 mmol/L   CO2 22 22 - 32 mmol/L   Glucose, Bld 100 (H) 70 - 99 mg/dL   BUN 9 6 - 20 mg/dL   Creatinine, Ser 0.38 (L) 0.44 - 1.00 mg/dL   Calcium 8.5 (L) 8.9 - 10.3 mg/dL   Total Protein 5.8 (L) 6.5 - 8.1 g/dL   Albumin 2.6 (L) 3.5 - 5.0 g/dL   AST 23 15 - 41 U/L   ALT 23 0 - 44 U/L   Alkaline Phosphatase 102 38 - 126 U/L   Total Bilirubin 0.5 0.3 - 1.2 mg/dL   GFR, Estimated >60 >60 mL/min   Anion gap 8 5 - 15  Protein / creatinine ratio, urine     Status: None   Collection Time: 08/28/20  4:53 PM  Result Value Ref Range   Creatinine, Urine 23.77 mg/dL   Total Protein, Urine <6 mg/dL   Protein Creatinine Ratio        0.00 - 0.15 mg/mg[Cre]     MAU Course  Procedures  MDM Headache improved after Flexeril.  Blood work normal.  Initial blood pressure was elevated.  Patient did have 1 elevated blood pressure at the very end as well.  We will treat  with Procardia in order to help reduce blood pressure, especially as patient's blood pressure at home was also elevated  Assessment and Plan   1.  Gestational hypertension, antepartum   2. Status post cesarean section    Discharge to home on Procardia.  Patient has follow-up with primary OB tomorrow.  We will send this to their office.  Truett Mainland 08/28/2020, 6:20 PM

## 2020-09-02 ENCOUNTER — Telehealth (HOSPITAL_COMMUNITY): Payer: Self-pay

## 2020-09-02 NOTE — Telephone Encounter (Signed)
"  I'm doing good." Patient has no questions or concerns about her healing.  "The baby is doing good. We went to the pediatrician and everything was good. Baby is feeding good. Baby sleeps in a crib." Patient has no questions or concerns about baby.  EPDS score is 0.   Sharyn Lull [facility] 09/02/2020,1510

## 2021-11-13 IMAGING — US US OB < 14 WEEKS - US OB TV
1 series · 15 of 28 positions shown · non-contrast
Comparison: None.

CLINICAL DATA: Pregnant patient with pain.

EXAM:
OBSTETRIC <14 WK US AND TRANSVAGINAL OB US
TECHNIQUE: Both transabdominal and transvaginal ultrasound examinations were
performed for complete evaluation of the gestation as well as the
maternal uterus, adnexal regions, and pelvic cul-de-sac.
Transvaginal technique was performed to assess early pregnancy.

[Series 1: us ob < 14 weeks - us ob tv · 15 of 45 slices shown]
[im 1/45]
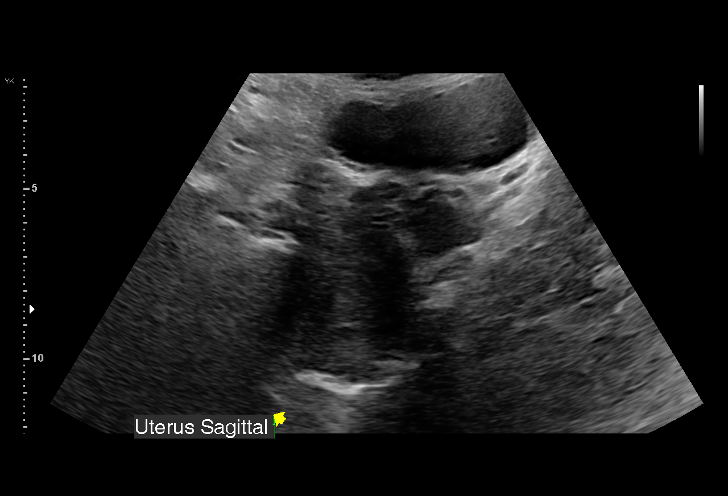
[im 4/45]
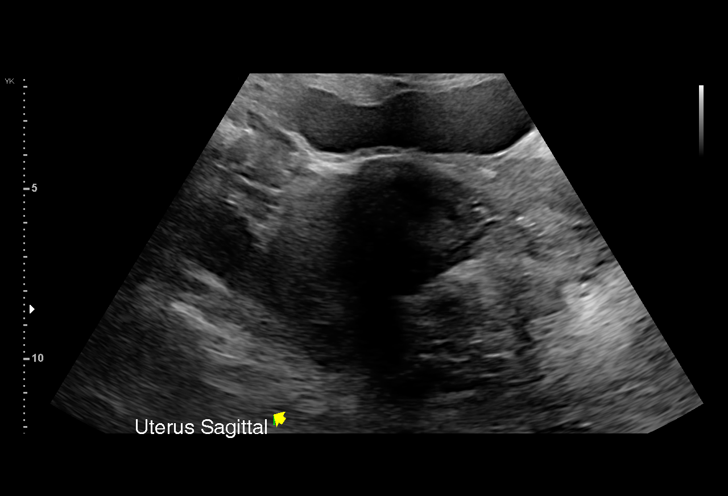
[im 7/45]
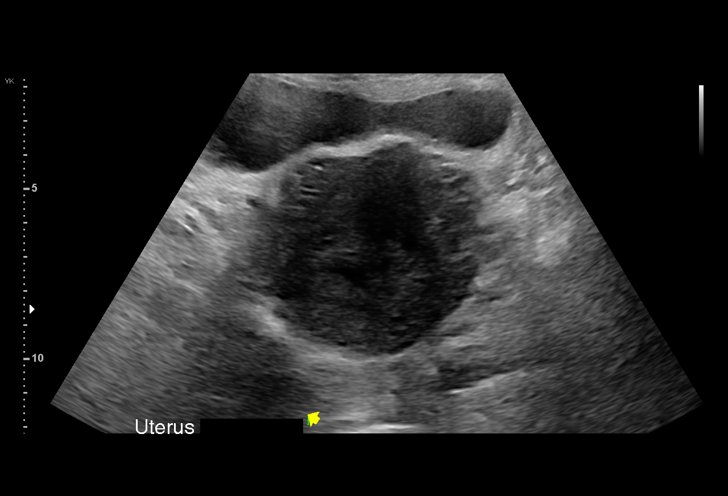
[im 10/45]
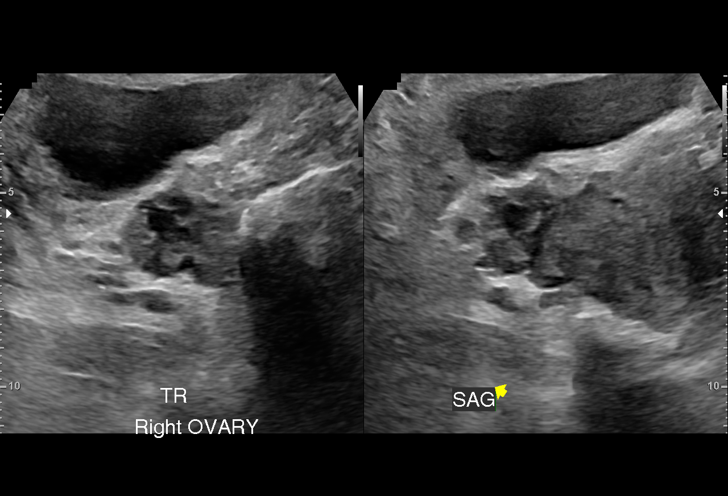
[im 14/45]
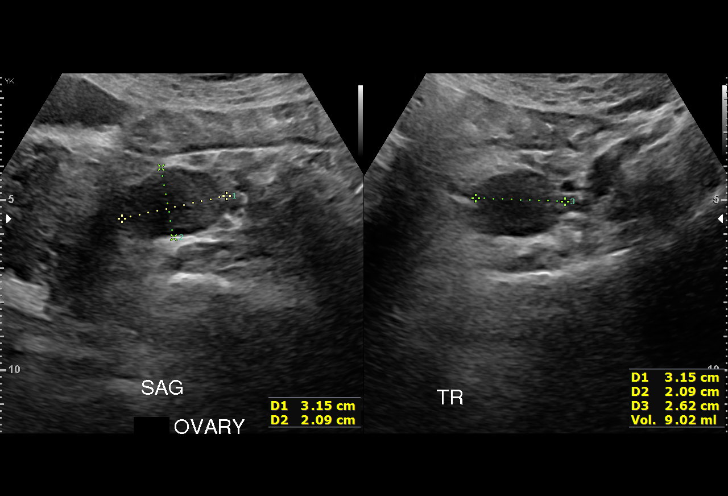
[im 17/45]
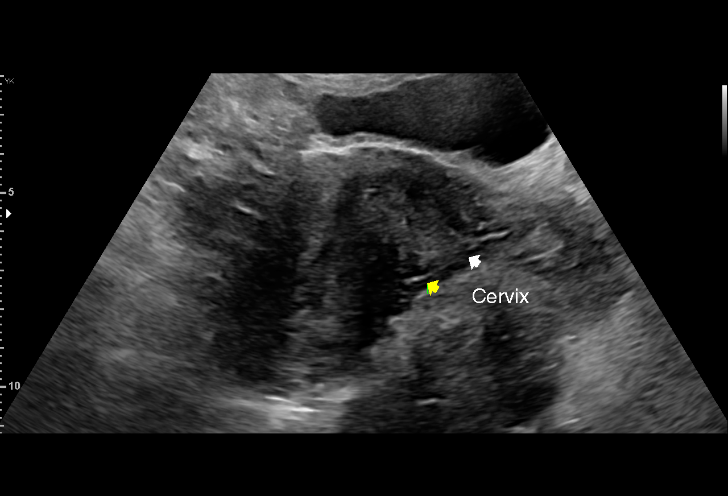
[im 20/45]
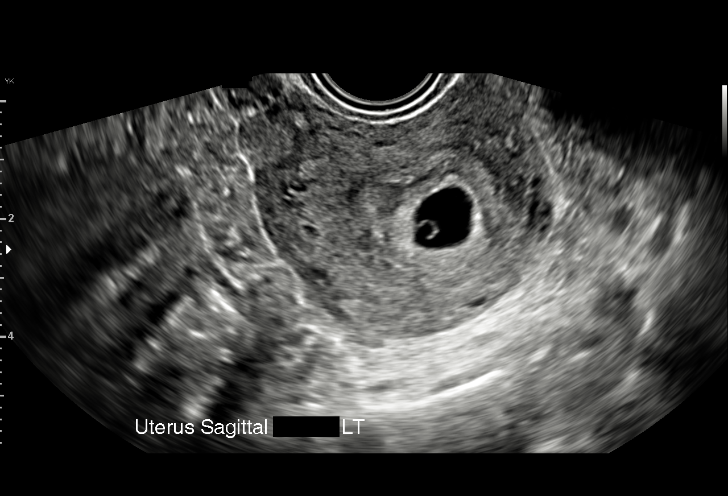
[im 23/45]
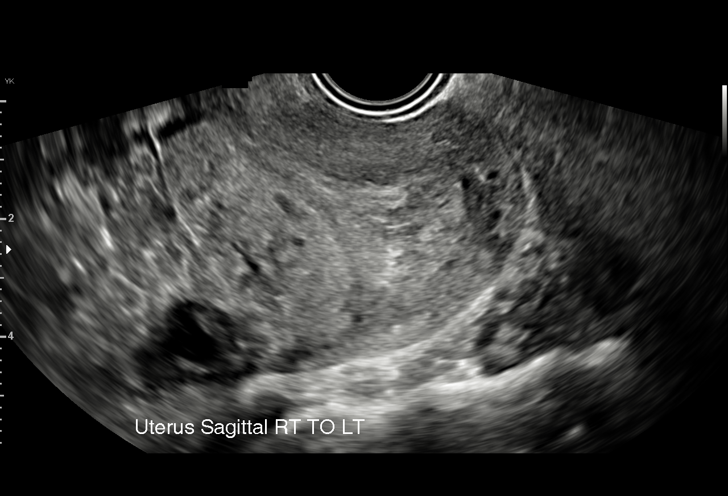
[im 25/45]
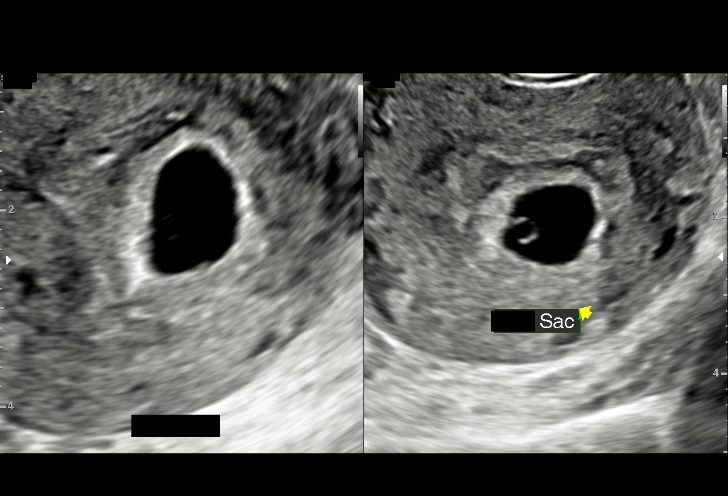
[im 28/45]
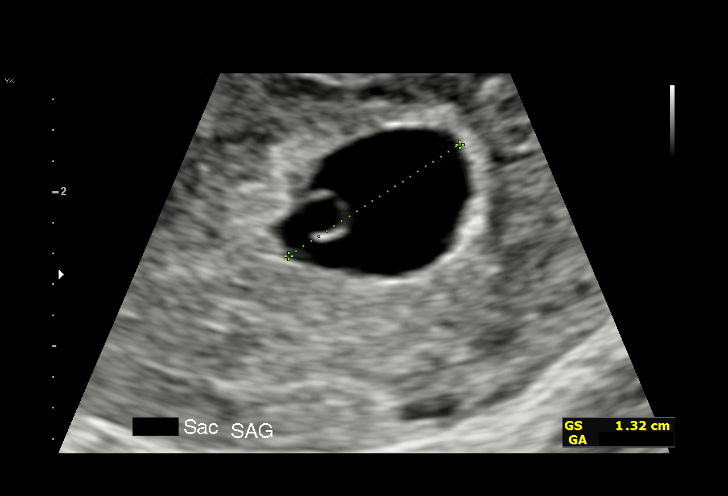
[im 31/45]
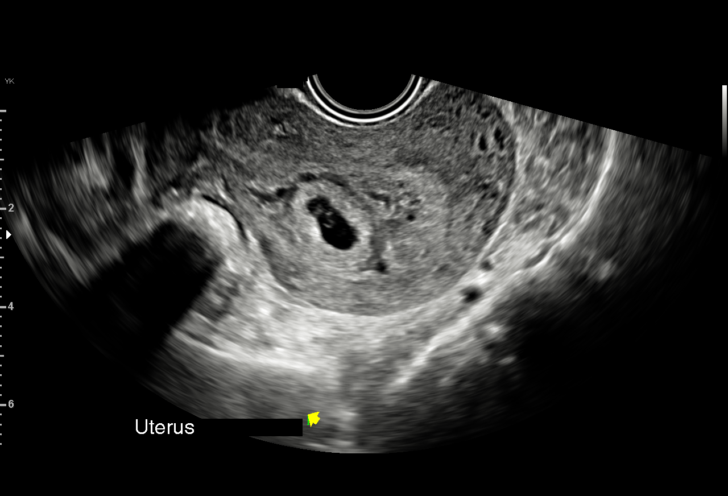
[im 35/45]
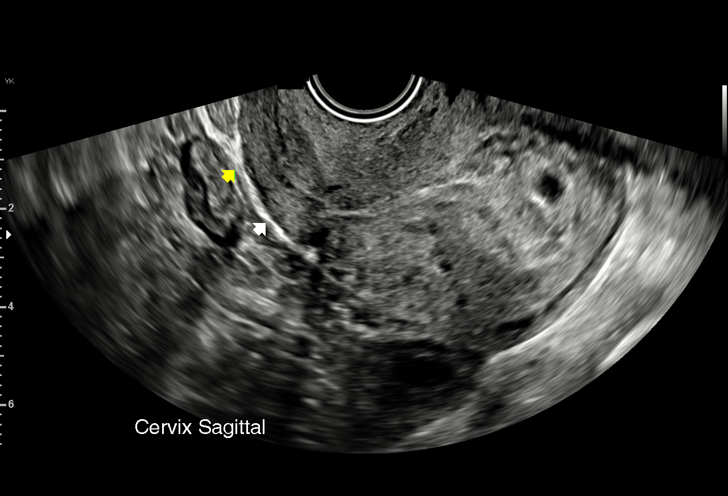
[im 38/45]
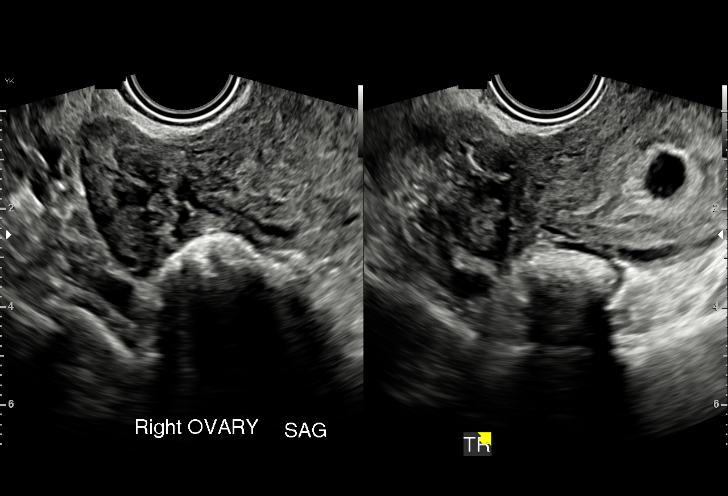
[im 41/45]
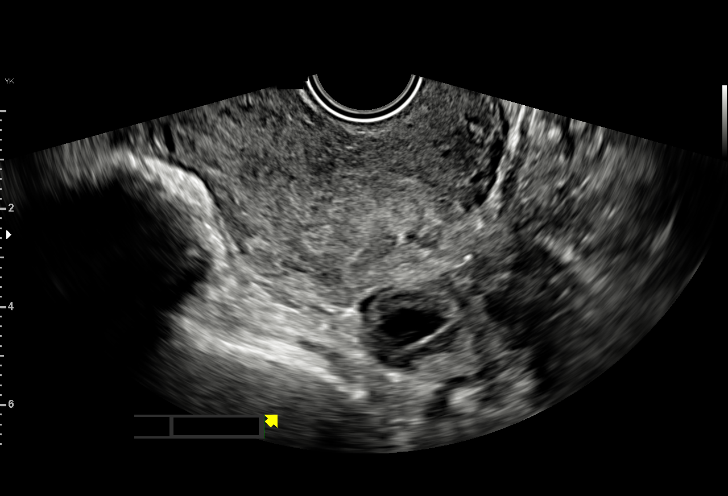
[im 45/45]
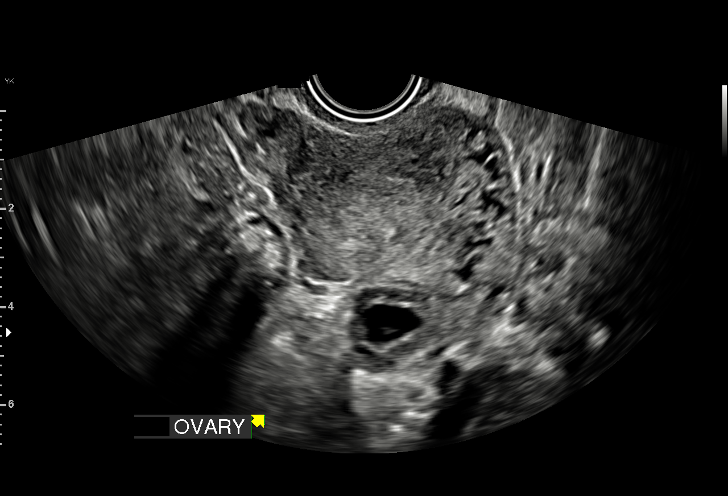

[15 of 28 positions shown; findings below may reference images not displayed]

FINDINGS: Intrauterine gestational sac: Single

Yolk sac:  Visualized.

Embryo:  Not Visualized.

MSD: 10.9 mm mm   5 w   5 d

CRL:    mm    w    d                  US EDC:

Subchorionic hemorrhage:  None visualized.

Maternal uterus/adnexae: The ovaries are normal in appearance. No
free fluid in the pelvis.
IMPRESSION: An intrauterine pregnancy is identified with a gestational sac and
yolk sac. A fetal pole is not yet seen.

## 2022-07-02 ENCOUNTER — Encounter (HOSPITAL_BASED_OUTPATIENT_CLINIC_OR_DEPARTMENT_OTHER): Payer: Self-pay

## 2022-07-02 ENCOUNTER — Other Ambulatory Visit: Payer: Self-pay

## 2022-07-02 ENCOUNTER — Emergency Department (HOSPITAL_BASED_OUTPATIENT_CLINIC_OR_DEPARTMENT_OTHER)
Admission: EM | Admit: 2022-07-02 | Discharge: 2022-07-02 | Disposition: A | Discharge status: Home / Self Care | Payer: BC Managed Care – PPO | Attending: Emergency Medicine | Admitting: Emergency Medicine

## 2022-07-02 DIAGNOSIS — R519 Headache, unspecified: Secondary | ICD-10-CM | POA: Diagnosis present

## 2022-07-02 MED ORDER — KETOROLAC TROMETHAMINE 15 MG/ML IJ SOLN
15.0000 mg | Freq: Once | INTRAMUSCULAR | Status: AC
  Administered 2022-07-02: 15 mg via INTRAVENOUS

## 2022-07-02 MED ORDER — DIPHENHYDRAMINE HCL 50 MG/ML IJ SOLN
25.0000 mg | Freq: Once | INTRAMUSCULAR | Status: AC
  Administered 2022-07-02: 25 mg via INTRAVENOUS

## 2022-07-02 MED ORDER — KETOROLAC TROMETHAMINE 15 MG/ML IJ SOLN
15.0000 mg | Freq: Once | INTRAMUSCULAR | Status: DC
  Filled 2022-07-02: qty 1

## 2022-07-02 MED ORDER — PROCHLORPERAZINE EDISYLATE 10 MG/2ML IJ SOLN
10.0000 mg | Freq: Once | INTRAMUSCULAR | Status: AC
  Administered 2022-07-02: 10 mg via INTRAVENOUS

## 2022-07-02 MED ORDER — DEXAMETHASONE 4 MG PO TABS
10.0000 mg | ORAL_TABLET | Freq: Once | ORAL | Status: AC
  Administered 2022-07-02: 10 mg via ORAL
  Filled 2022-07-02: qty 3

## 2022-07-02 MED ORDER — DIPHENHYDRAMINE HCL 50 MG/ML IJ SOLN
25.0000 mg | Freq: Once | INTRAMUSCULAR | Status: DC
  Filled 2022-07-02: qty 1

## 2022-07-02 MED ORDER — PROCHLORPERAZINE EDISYLATE 10 MG/2ML IJ SOLN
10.0000 mg | Freq: Once | INTRAMUSCULAR | Status: DC
  Filled 2022-07-02: qty 2

## 2022-07-02 NOTE — Discharge Instructions (Signed)
Please help with your family doctor in the office.  Please return for sudden worsening headache one-sided numbness or weakness or difficulty with speech or swallowing.

## 2022-07-02 NOTE — ED Provider Notes (Signed)
[facility] Provider Note   CSN: [id number] Arrival date & time: 07/02/22  1818     History  Chief Complaint  Patient presents with   Headache    Julie Fleming is a 37 y.o. female.  37 yo F with a chief complaint of a headache.  This been going on for the past 3 to 4 days.  Starts on the posterior aspect of the right occiput and then radiates to the frontal region.  Worse with certain head movements talking.  She denies trauma to the area.  Denies cough congestion or fever.  Denies one-sided numbness or weakness denies difficulty speech or swallowing.  She does get headaches not infrequently.  Typically drinks some caffeine and takes some Tylenol and it goes away.  She is done this last couple days.  Saw her family doctor this morning and then had recurrence of her headache and went to urgent care and they told her she needed to come here for CT imaging.   Headache      Home Medications Prior to Admission medications   Medication Sig Start Date End Date Taking? Authorizing Provider  acetaminophen (TYLENOL) 500 MG tablet Take 2 tablets (1,000 mg total) by mouth every 8 (eight) hours as needed for mild pain or moderate pain. 08/24/20   Gerald Leitz, MD  hydrOXYzine (VISTARIL) 25 MG capsule Take 1 capsule (25 mg total) by mouth 3 (three) times daily as needed for itching. 08/26/20   Sheila Oats, MD  ibuprofen (ADVIL) 600 MG tablet Take 1 tablet (600 mg total) by mouth every 6 (six) hours as needed. 08/24/20   Gerald Leitz, MD  NIFEdipine (PROCARDIA-XL/NIFEDICAL-XL) 30 MG 24 hr tablet Take 1 tablet (30 mg total) by mouth daily. 08/28/20   Levie Heritage, DO  Prenatal Vit-Fe Fumarate-FA (MULTIVITAMIN-PRENATAL) 27-0.8 MG TABS tablet Take 1 tablet by mouth daily at 12 noon.    [provider]  psyllium (REGULOID) 0.52 g capsule Take 0.52 g by mouth daily as needed (Constipation).    [provider]  sennosides-docusate  sodium (SENOKOT-S) 8.6-50 MG tablet Take 1 tablet by mouth daily as needed for constipation.    [provider]      Allergies    Other    Review of Systems   Review of Systems  Neurological:  Positive for headaches.    Physical Exam Updated Vital Signs BP (!) 147/102 (BP Location: Left Arm)   Pulse 79   Temp 98.2 F (36.8 C) (Oral)   Resp 18   Ht 5\' 3"  (1.6 m)   Wt 54.4 kg   LMP 07/02/2022 (Exact Date)   SpO2 100%   Breastfeeding Yes   BMI 21.26 kg/m  Physical Exam Vitals and nursing note reviewed.  Constitutional:      General: She is not in acute distress.    Appearance: She is well-developed. She is not diaphoretic.  HENT:     Head: Normocephalic and atraumatic.     Comments: Mild congestion to the nasal sinuses.  No obvious pain with percussion over the sinuses.  TMs are normal bilaterally.  She has some pain at the attachment of the trapezius to the occiput bilaterally. Eyes:     Pupils: Pupils are equal, round, and reactive to light.  Cardiovascular:     Rate and Rhythm: Normal rate and regular rhythm.     Heart sounds: No murmur heard.    No friction rub. No gallop.  Pulmonary:  Effort: Pulmonary effort is normal.     Breath sounds: No wheezing or rales.  Abdominal:     General: There is no distension.     Palpations: Abdomen is soft.     Tenderness: There is no abdominal tenderness.  Musculoskeletal:        General: No tenderness.     Cervical back: Normal range of motion and neck supple.  Skin:    General: Skin is warm and dry.  Neurological:     Mental Status: She is alert and oriented to person, place, and time.     GCS: GCS eye subscore is 4. GCS verbal subscore is 5. GCS motor subscore is 6.     Cranial Nerves: Cranial nerves 2-12 are intact.     Sensory: Sensation is intact.     Motor: Motor function is intact.     Coordination: Coordination is intact.     Comments: Benign neurologic exam.  Ambulates without issue.  Psychiatric:         Behavior: Behavior normal.     ED Results / Procedures / Treatments   Labs (all labs ordered are listed, but only abnormal results are displayed) Labs Reviewed - No data to display  EKG None  Radiology No results found.  Procedures Procedures    Medications Ordered in ED Medications  ketorolac (TORADOL) 15 MG/ML injection 15 mg (15 mg Intramuscular Not Given 07/02/22 1929)  dexamethasone (DECADRON) tablet 10 mg (10 mg Oral Given 07/02/22 1928)  ketorolac (TORADOL) 15 MG/ML injection 15 mg (15 mg Intravenous Given 07/02/22 1939)  prochlorperazine (COMPAZINE) injection 10 mg (10 mg Intravenous Given 07/02/22 1940)  diphenhydrAMINE (BENADRYL) injection 25 mg (25 mg Intravenous Given 07/02/22 1938)    ED Course/ Medical Decision Making/ A&P                             Medical Decision Making Risk Prescription drug management.   37 yo F with a cc of a headache.  Going on for 3 days.  She has no red flags.  Benign neurologic exam.  She was sent by urgent care to have a CT.  I am not sure that that is warranted.  Will treat with a headache cocktail.  Reassess.  Feeling better on repeat assessment will d/c home.    8:28 PM:  I have discussed the diagnosis/risks/treatment options with the patient and family.  Evaluation and diagnostic testing in the emergency department does not suggest an emergent condition requiring admission or immediate intervention beyond what has been performed at this time.  They will follow up with PCP. We also discussed returning to the ED immediately if new or worsening sx occur. We discussed the sx which are most concerning (e.g., sudden worsening pain, fever, inability to tolerate by mouth) that necessitate immediate return. Medications administered to the patient during their visit and any new prescriptions provided to the patient are listed below.  Medications given during this visit Medications  ketorolac (TORADOL) 15 MG/ML injection 15 mg (15 mg  Intramuscular Not Given 07/02/22 1929)  dexamethasone (DECADRON) tablet 10 mg (10 mg Oral Given 07/02/22 1928)  ketorolac (TORADOL) 15 MG/ML injection 15 mg (15 mg Intravenous Given 07/02/22 1939)  prochlorperazine (COMPAZINE) injection 10 mg (10 mg Intravenous Given 07/02/22 1940)  diphenhydrAMINE (BENADRYL) injection 25 mg (25 mg Intravenous Given 07/02/22 1938)     The patient appears reasonably screen and/or stabilized for discharge and I doubt any  other medical condition or other [facility] requiring further screening, evaluation, or treatment in the ED at this time prior to discharge.          Final Clinical Impression(s) / ED Diagnoses Final diagnoses:  Frontal headache    Rx / DC Orders ED Discharge Orders          Ordered    Ambulatory referral to Neurology       Comments: Headache syndrome   07/02/22 2025              Melene Plan, DO 07/02/22 2028

## 2022-07-02 NOTE — ED Triage Notes (Signed)
Pt reports headache x 5 days associated with pain behind right ear as well as facial pain. Her headache is worse with any head movement. Denies photosensitivity. She was sent here from Urgent Care to have a head CT.

## 2022-08-12 ENCOUNTER — Encounter: Payer: Self-pay | Admitting: Neurology

## 2022-08-12 ENCOUNTER — Ambulatory Visit: Payer: BC Managed Care – PPO | Admitting: Neurology

## 2022-08-12 VITALS — BP 143/99 | HR 94 | Ht 63.0 in | Wt 122.0 lb

## 2022-08-12 DIAGNOSIS — G43709 Chronic migraine without aura, not intractable, without status migrainosus: Secondary | ICD-10-CM | POA: Diagnosis not present

## 2022-08-12 MED ORDER — SUMATRIPTAN SUCCINATE 50 MG PO TABS: 50.0000 mg | ORAL_TABLET | ORAL | 11 refills | Status: AC | PRN

## 2022-08-12 MED ORDER — NORTRIPTYLINE HCL 10 MG PO CAPS: 20.0000 mg | ORAL_CAPSULE | Freq: Every day | ORAL | 11 refills | Status: AC

## 2022-08-12 NOTE — Progress Notes (Unsigned)
Chief Complaint  Patient presents with   New Patient (Initial Visit)    Rm13, alone New patient, internal referral for HA:10 days in the past month      ASSESSMENT AND PLAN  Julie Fleming is a 37 y.o. female   Chronic migraine  Start preventive medication nortriptyline 10 mg titrating to 20 every night as preventive medications  Imitrex as needed  DIAGNOSTIC DATA (LABS, IMAGING, TESTING) - I reviewed patient records, labs, notes, testing and imaging myself where available.   MEDICAL HISTORY:  Julie Fleming is a 37 year old female, seen in request by her primary care from [facility] nurse practitioner Zoe Lan for evaluation of headache, initial evaluation was August 12, 2022  I reviewed and summarized the referring note. PMHX.  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 63 years old intermittently, since 2023, she began to have frequent migraine headache, described as lateralized pounding headache with light noise sensitivity, lasting for couple hours, over-the-counter caffeine and Tylenol was helpful  Increased headaches since April 2024, couple times each week, went to urgent care in May 20 5:24 days of prolonged headaches fail to improve home treatment, she was giving headache cocktail, Toradol, Compazine, Benadryl, headache has much improved  In addition she complains of intermittent vertigo associated with her headache, sometimes transient spinning sensation lying down,  She denies persistent motor or sensory deficit no visual loss, trigger for her migraine, stress, sleep deprivation, weather change,  PHYSICAL EXAM:   Vitals:   08/12/22 0939  BP: (!) 143/99  Pulse: 94  Weight: 122 lb (55.3 kg)  Height: 5\' 3"  (1.6 m)   Body mass index is 21.61 kg/m.  PHYSICAL EXAMNIATION:  Gen: NAD, conversant, well nourised, well groomed                     Cardiovascular: Regular rate rhythm, no peripheral edema, warm, nontender. Eyes: Conjunctivae clear  without exudates or hemorrhage Neck: Supple, no carotid bruits. Pulmonary: Clear to auscultation bilaterally   NEUROLOGICAL EXAM:  MENTAL STATUS: Speech/cognition: Awake, alert, oriented to history taking and casual conversation CRANIAL NERVES: CN II: Visual fields are full to confrontation. Pupils are round equal and briskly reactive to light. CN III, IV, VI: extraocular movement are normal. No ptosis. CN V: Facial sensation is intact to light touch CN VII: Face is symmetric with normal eye closure  CN VIII: Hearing is normal to causal conversation. CN IX, X: Phonation is normal. CN XI: Head turning and shoulder shrug are intact  MOTOR: There is no pronator drift of out-stretched arms. Muscle bulk and tone are normal. Muscle strength is normal.  REFLEXES: Reflexes are 2+ and symmetric at the biceps, triceps, knees, and ankles. Plantar responses are flexor.  SENSORY: Intact to light touch, pinprick and vibratory sensation are intact in fingers and toes.  COORDINATION: There is no trunk or limb dysmetria noted.  GAIT/STANCE: Posture is normal. Gait is steady with normal steps, base, arm swing, and turning. Heel and toe walking are normal. Tandem gait is normal.  Romberg is absent.  REVIEW OF SYSTEMS:  Full 14 system review of systems performed and notable only for as above All other review of systems were negative.   ALLERGIES: Allergies  Allergen Reactions   Other Rash    Severe blistering reaction to honeycomb dressing s/p Cesarean    HOME MEDICATIONS: Current Outpatient Medications  Medication Sig Dispense Refill   acetaminophen (TYLENOL) 500 MG tablet Take 2 tablets (1,000 mg total) by mouth  every 8 (eight) hours as needed for mild pain or moderate pain. 30 tablet 0   cyanocobalamin (VITAMIN B12) 500 MCG tablet Take 500 mcg by mouth daily.     ergocalciferol (DRISDOL) 1.25 MG (50000 UT) capsule Take 50,000 Units by mouth once a week.     No current  facility-administered medications for this visit.    PAST MEDICAL HISTORY: Past Medical History:  Diagnosis Date   Gestational diabetes    History of pre-eclampsia     PAST SURGICAL HISTORY: Past Surgical History:  Procedure Laterality Date   CESAREAN SECTION N/A 12/30/2017   Procedure: CESAREAN SECTION;  Surgeon: Geryl Rankins, MD;  Location: [facility];  Service: Obstetrics;  Laterality: N/A;   CESAREAN SECTION N/A 08/22/2020   Procedure: CESAREAN SECTION;  Surgeon: Gerald Leitz, MD;  Location: [facility];  Service: Obstetrics;  Laterality: N/A;   NO PAST SURGERIES      FAMILY HISTORY: Family History  Problem Relation Age of Onset   Diabetes Mother    Hypertension Mother    Healthy Father     SOCIAL HISTORY: Social History   Socioeconomic History   Marital status: Married    Spouse name: venkad   Number of children: 2   Years of education: Not on file   Highest education level: Not on file  Occupational History   Not on file  Tobacco Use   Smoking status: Never   Smokeless tobacco: Never  Vaping Use   Vaping Use: Never used  Substance and Sexual Activity   Alcohol use: No   Drug use: No   Sexual activity: Yes    Birth control/protection: None  Other Topics Concern   Not on file  Social History Narrative   Not on file   Social Determinants of Health   Financial Resource Strain: Low Risk  (12/26/2017)   Overall Financial Resource Strain (CARDIA)    Difficulty of Paying Living Expenses: Not hard at all  Food Insecurity: No Food Insecurity (12/26/2017)   Hunger Vital Sign    Worried About Running Out of Food in the Last Year: Never true    Ran Out of Food in the Last Year: Never true  Transportation Needs: Unknown (12/26/2017)   PRAPARE - Administrator, Civil Service (Medical): No    Lack of Transportation (Non-Medical): Not on file  Physical Activity: Not on file  Stress: No Stress Concern Present (12/26/2017)   [employer]  of Occupational Health - Occupational Stress Questionnaire    Feeling of Stress : Only a little  Social Connections: Not on file  Intimate Partner Violence: Not At Risk (12/26/2017)   Humiliation, Afraid, Rape, and Kick questionnaire    Fear of Current or Ex-Partner: No    Emotionally Abused: No    Physically Abused: No    Sexually Abused: No      Levert Feinstein, M.D. Ph.D.  [facility] [address], [address] [place], [place] [address] Ph: [phone] Fax: [phone]  CC:  Melene Plan, DO [address] [place],  [place] [address]  Iona Hansen, NP  +

## 2022-11-15 ENCOUNTER — Ambulatory Visit
Admission: EM | Admit: 2022-11-15 | Discharge: 2022-11-15 | Disposition: A | Discharge status: Home / Self Care | Payer: BC Managed Care – PPO | Attending: Internal Medicine | Admitting: Internal Medicine

## 2022-11-15 DIAGNOSIS — R42 Dizziness and giddiness: Secondary | ICD-10-CM | POA: Diagnosis not present

## 2022-11-15 LAB — POCT FASTING CBG KUC MANUAL ENTRY: POCT Glucose (KUC): 70 mg/dL (ref 70–99)

## 2022-11-15 NOTE — ED Provider Notes (Addendum)
[facility]    CSN: [id number] Arrival date & time: 11/15/22  0949      History   Chief Complaint Chief Complaint  Patient presents with   Dizziness    HPI Julie Fleming is a 37 y.o. female presents for dizziness.  Patient reports 1 week of intermittent daily episodes of dizziness that she describes as "feeling off" but denies sensation of spinning.  States it can last 1 to 2 hours and resolves on its own.  Happens 2-3 times a day.  Denies any correlation with these episodes in regards to position change or head turning.  She denies any headaches, visual changes, ear pain, neck pain, nausea/vomiting, chest pain, shortness of breath, dysuria with these episodes.  Denies any recent upper respiratory illnesses.  Denies history of dizziness in the past.  She states symptoms improve if she lays down or goes to sleep cannot identify any aggravating factors.  She was seen at another urgent care 1 week ago for same complaint and was informed it was vertigo and prescribed meclizine.  She has not tried this medication.  She also states there is no workup done in the clinic for her dizziness.  She has an appoint with her PCP next week for follow-up.  She states she is eating and drinking normally and stays hydrated.  No other concerns at this time.   Dizziness   Past Medical History:  Diagnosis Date   Gestational diabetes    History of pre-eclampsia     Patient Active Problem List   Diagnosis Date Noted   Chronic migraine w/o aura w/o status migrainosus, not intractable 08/12/2022   H/O cesarean section complicating pregnancy 08/22/2020   Preeclampsia in postpartum period 01/07/2018   Gestational diabetes mellitus (GDM) in third trimester 12/29/2017    Past Surgical History:  Procedure Laterality Date   CESAREAN SECTION N/A 12/30/2017   Procedure: CESAREAN SECTION;  Surgeon: Geryl Rankins, MD;  Location: [facility];  Service: Obstetrics;  Laterality: N/A;    CESAREAN SECTION N/A 08/22/2020   Procedure: CESAREAN SECTION;  Surgeon: Gerald Leitz, MD;  Location: [facility] ORS;  Service: Obstetrics;  Laterality: N/A;   NO PAST SURGERIES      OB History     Gravida  3   Para  2   Term  2   Preterm      AB  1   Living  2      SAB  1   IAB      Ectopic      Multiple  0   Live Births  2            Home Medications    Prior to Admission medications   Medication Sig Start Date End Date Taking? Authorizing Provider  cyanocobalamin (VITAMIN B12) 500 MCG tablet Take 500 mcg by mouth daily.   Yes [provider]  acetaminophen (TYLENOL) 500 MG tablet Take 2 tablets (1,000 mg total) by mouth every 8 (eight) hours as needed for mild pain or moderate pain. 08/24/20   Gerald Leitz, MD  ergocalciferol (DRISDOL) 1.25 MG (50000 UT) capsule Take 50,000 Units by mouth once a week. 09/10/21   [provider]  nortriptyline (PAMELOR) 10 MG capsule Take 2 capsules (20 mg total) by mouth at bedtime. 08/12/22   Levert Feinstein, MD  SUMAtriptan (IMITREX) 50 MG tablet Take 1 tablet (50 mg total) by mouth every 2 (two) hours as needed for migraine. May repeat in 2 hours  if headache persists or recurs. 08/12/22   Levert Feinstein, MD    Family History Family History  Problem Relation Age of Onset   Diabetes Mother    Hypertension Mother    Healthy Father     Social History Social History   Tobacco Use   Smoking status: Never   Smokeless tobacco: Never  Vaping Use   Vaping status: Never Used  Substance Use Topics   Alcohol use: No   Drug use: No     Allergies   Other   Review of Systems Review of Systems  Neurological:  Positive for dizziness.     Physical Exam Triage Vital Signs ED Triage Vitals  Encounter Vitals Group     BP 11/15/22 0957 (!) 142/89     Systolic BP Percentile --      Diastolic BP Percentile --      Pulse Rate 11/15/22 0957 64     Resp 11/15/22 0957 16     Temp 11/15/22 0957 97.7 F (36.5 C)     Temp  Source 11/15/22 0957 Oral     SpO2 11/15/22 0957 100 %     Weight --      Height --      Head Circumference --      Peak Flow --      Pain Score 11/15/22 1000 0     Pain Loc --      Pain Education --      Exclude from Growth Chart --    Orthostatic VS for the past 24 hrs:  BP- Lying Pulse- Lying BP- Sitting Pulse- Sitting BP- Standing at 0 minutes Pulse- Standing at 0 minutes  11/15/22 1004 129/89 69 (!) 140/97 66 127/89 73    Updated Vital Signs BP (!) 142/89 (BP Location: Left Arm)   Pulse 64   Temp 97.7 F (36.5 C) (Oral)   Resp 16   LMP 11/09/2022 (Approximate)   SpO2 100%   Breastfeeding No   Visual Acuity Right Eye Distance:   Left Eye Distance:   Bilateral Distance:    Right Eye Near:   Left Eye Near:    Bilateral Near:     Physical Exam Vitals and nursing note reviewed.  Constitutional:      General: She is not in acute distress.    Appearance: Normal appearance. She is normal weight. She is not ill-appearing, toxic-appearing or diaphoretic.  HENT:     Head: Normocephalic and atraumatic.     Right Ear: Tympanic membrane and ear canal normal.     Left Ear: Tympanic membrane and ear canal normal.  Cardiovascular:     Rate and Rhythm: Normal rate and regular rhythm.     Heart sounds: Normal heart sounds.  Pulmonary:     Effort: Pulmonary effort is normal.     Breath sounds: Normal breath sounds.  Skin:    General: Skin is warm and dry.  Neurological:     General: No focal deficit present.     Mental Status: She is alert and oriented to person, place, and time.     GCS: GCS eye subscore is 4. GCS verbal subscore is 5. GCS motor subscore is 6.     Cranial Nerves: No facial asymmetry.     Motor: No weakness or pronator drift.     Coordination: Romberg sign negative.  Psychiatric:        Mood and Affect: Mood normal.        Behavior: Behavior normal.  UC Treatments / Results  Labs (all labs ordered are listed, but only abnormal results are  displayed) Labs Reviewed  CBC  BASIC METABOLIC PANEL  TSH  POCT FASTING CBG [facility] MANUAL ENTRY    EKG   Radiology No results found.  Procedures ED EKG  Date/Time: 11/15/2022 10:46 AM  Performed by: Radford Pax, NP Authorized by: Radford Pax, NP   ECG interpreted by ED Physician in the absence of a cardiologist: no   Previous ECG:    Previous ECG:  Unavailable Interpretation:    Interpretation: non-specific   Rate:    ECG rate:  69   ECG rate assessment: normal   Rhythm:    Rhythm: sinus rhythm   Ectopy:    Ectopy: none   QRS:    QRS axis:  Normal   QRS conduction: normal   ST segments:    ST segments:  Normal T waves:    T waves: non-specific   Q waves:    Abnormal Q-waves: not present    (including critical care time)  Medications Ordered in UC Medications - No data to display  Initial Impression / Assessment and Plan / UC Course  I have reviewed the triage vital signs and the nursing notes.  Pertinent labs & imaging results that were available during my care of the patient were reviewed by me and considered in my medical decision making (see chart for details).     I reviewed exam and symptoms with patient.  She is well-appearing and in no acute distress.  There is no red flags on exam.  Orthostatic blood pressures normal.  Blood sugar was 70.  EKG with nonspecific T wave changes otherwise unremarkable.  I discussed with patient multiple potential causes of dizziness including neurological, cardiac, dehydration, thyroid, etc.  Will check BMP, CBC, and TSH.  Advise keeping a log of her dizziness episodes to correlate with either activity eating etc.  Discussed remaining hydrated and eating regularly.  Advised patient can try the meclizine previously prescribed for her.  Patient to follow-up with PCP at her scheduled appointment next week.  Strict ER precautions reviewed and patient verbalized understanding. Final Clinical Impressions(s) / UC Diagnoses    Final diagnoses:  Dizziness     Discharge Instructions      Clinical contact you with results of the blood work done today and positive.  Please stay hydrated and eat small frequent meals.  Keep a log of your symptoms to take with your PCP at your scheduled appointment next week for follow-up evaluation.  Please go to the ER if you develop any worsening symptoms prior to seeing your PCP.  I hope you feel better soon!     ED Prescriptions   None    PDMP not reviewed this encounter.   Radford Pax, NP 11/15/22 1049    Radford Pax, NP 11/15/22 1050    Radford Pax, NP 11/15/22 1051

## 2022-11-15 NOTE — Discharge Instructions (Signed)
Clinical contact you with results of the blood work done today and positive.  Please stay hydrated and eat small frequent meals.  Keep a log of your symptoms to take with your PCP at your scheduled appointment next week for follow-up evaluation.  Please go to the ER if you develop any worsening symptoms prior to seeing your PCP.  I hope you feel better soon!

## 2022-11-15 NOTE — ED Triage Notes (Signed)
Pt states she feels Lightheaded and dizzy for 1-2 hours that comes and goes all day, it started a week ago.

## 2022-11-16 LAB — BASIC METABOLIC PANEL
BUN/Creatinine Ratio: 13 (ref 9–23)
BUN: 6 mg/dL (ref 6–20)
CO2: 22 mmol/L (ref 20–29)
Calcium: 9.6 mg/dL (ref 8.7–10.2)
Chloride: 106 mmol/L (ref 96–106)
Creatinine, Ser: 0.48 mg/dL — ABNORMAL LOW (ref 0.57–1.00)
Glucose: 84 mg/dL (ref 70–99)
Potassium: 5 mmol/L (ref 3.5–5.2)
Sodium: 141 mmol/L (ref 134–144)
eGFR: 126 mL/min/{1.73_m2} (ref >59)

## 2022-11-16 LAB — CBC
Hematocrit: 42.6 % (ref 34.0–46.6)
Hemoglobin: 12.9 g/dL (ref 11.1–15.9)
MCH: 25.2 pg — ABNORMAL LOW (ref 26.6–33.0)
MCHC: 30.3 g/dL — ABNORMAL LOW (ref 31.5–35.7)
MCV: 83 fL (ref 79–97)
Platelets: 322 10*3/uL (ref 150–450)
RBC: 5.11 x10E6/uL (ref 3.77–5.28)
RDW: 13.3 % (ref 11.7–15.4)
WBC: 5.6 10*3/uL (ref 3.4–10.8)

## 2022-11-16 LAB — TSH: TSH: 1.94 u[IU]/mL (ref 0.450–4.500)

## 2023-02-16 ENCOUNTER — Telehealth: Payer: Self-pay | Admitting: Neurology

## 2023-02-16 NOTE — Telephone Encounter (Signed)
 Pt was called, a vm was left stating due to inclement weather the office will not open before 10:00am so their appointment is being cancelled, also stated pt will be called back on tomorrow to be rescheduled.

## 2023-02-17 ENCOUNTER — Ambulatory Visit: Payer: BC Managed Care – PPO | Admitting: Neurology

## 2023-02-17 NOTE — Telephone Encounter (Signed)
 Contacted pt to r/s appt, LVM rq a call back.
